# Patient Record
Sex: Female | Born: 1985 | Race: White | Hispanic: No | Marital: Married | Smoking: Never smoker
Health system: Southern US, Community
[De-identification: ages and names within clinical notes are randomized; demographics above are authoritative.]

## PROBLEM LIST (undated history)

## (undated) DIAGNOSIS — J45909 Unspecified asthma, uncomplicated: Secondary | ICD-10-CM

## (undated) DIAGNOSIS — E079 Disorder of thyroid, unspecified: Secondary | ICD-10-CM

## (undated) HISTORY — PX: APPENDECTOMY: SHX54

## (undated) HISTORY — PX: CHOLECYSTECTOMY: SHX55

## (undated) HISTORY — PX: KNEE SURGERY: SHX244

## (undated) HISTORY — PX: DILATION AND CURETTAGE OF UTERUS: SHX78

---

## 2008-10-12 ENCOUNTER — Emergency Department: Payer: Self-pay | Admitting: Emergency Medicine

## 2008-10-20 ENCOUNTER — Emergency Department: Payer: Self-pay | Admitting: Emergency Medicine

## 2008-11-21 ENCOUNTER — Emergency Department: Payer: Self-pay | Admitting: Unknown Physician Specialty

## 2009-01-14 ENCOUNTER — Emergency Department: Payer: Self-pay | Admitting: Emergency Medicine

## 2009-02-22 ENCOUNTER — Emergency Department: Payer: Self-pay | Admitting: Emergency Medicine

## 2009-06-24 ENCOUNTER — Emergency Department: Payer: Self-pay | Admitting: Emergency Medicine

## 2009-07-12 ENCOUNTER — Emergency Department: Payer: Self-pay | Admitting: Emergency Medicine

## 2009-11-25 ENCOUNTER — Observation Stay: Payer: Self-pay | Admitting: Obstetrics and Gynecology

## 2009-12-21 ENCOUNTER — Observation Stay: Payer: Self-pay | Admitting: Obstetrics and Gynecology

## 2009-12-24 ENCOUNTER — Emergency Department: Payer: Self-pay | Admitting: Unknown Physician Specialty

## 2010-01-22 ENCOUNTER — Observation Stay: Payer: Self-pay

## 2010-01-29 ENCOUNTER — Observation Stay: Payer: Self-pay | Admitting: Obstetrics & Gynecology

## 2010-03-07 ENCOUNTER — Observation Stay: Payer: Self-pay | Admitting: Obstetrics & Gynecology

## 2010-03-22 ENCOUNTER — Inpatient Hospital Stay: Payer: Self-pay | Admitting: Obstetrics and Gynecology

## 2010-03-31 ENCOUNTER — Emergency Department: Payer: Self-pay | Admitting: Emergency Medicine

## 2010-04-30 ENCOUNTER — Emergency Department: Payer: Self-pay | Admitting: Unknown Physician Specialty

## 2010-07-30 ENCOUNTER — Emergency Department: Payer: Self-pay | Admitting: Emergency Medicine

## 2010-10-10 ENCOUNTER — Emergency Department: Payer: Self-pay | Admitting: Emergency Medicine

## 2011-05-02 ENCOUNTER — Emergency Department: Payer: Self-pay | Admitting: Emergency Medicine

## 2011-05-26 ENCOUNTER — Emergency Department: Payer: Self-pay | Admitting: Unknown Physician Specialty

## 2011-06-07 ENCOUNTER — Emergency Department: Payer: Self-pay | Admitting: Emergency Medicine

## 2011-10-24 ENCOUNTER — Emergency Department: Payer: Self-pay | Admitting: Emergency Medicine

## 2012-01-27 ENCOUNTER — Emergency Department: Payer: Self-pay | Admitting: Emergency Medicine

## 2012-01-27 LAB — CBC
HCT: 32.6 % — ABNORMAL LOW (ref 35.0–47.0)
HGB: 11.5 g/dL — ABNORMAL LOW (ref 12.0–16.0)
MCHC: 35.4 g/dL (ref 32.0–36.0)
MCV: 87 fL (ref 80–100)
Platelet: 316 10*3/uL (ref 150–440)
RDW: 13.1 % (ref 11.5–14.5)

## 2012-01-27 LAB — URINALYSIS, COMPLETE
Glucose,UR: NEGATIVE mg/dL (ref 0–75)
Ketone: NEGATIVE
Leukocyte Esterase: NEGATIVE
Nitrite: NEGATIVE
Ph: 6 (ref 4.5–8.0)
Protein: NEGATIVE
WBC UR: 1 /HPF (ref 0–5)

## 2012-06-02 ENCOUNTER — Observation Stay: Payer: Self-pay | Admitting: Obstetrics and Gynecology

## 2012-06-02 LAB — URINALYSIS, COMPLETE
Bacteria: NONE SEEN
Glucose,UR: NEGATIVE mg/dL (ref 0–75)
Leukocyte Esterase: NEGATIVE
Nitrite: NEGATIVE
Ph: 6 (ref 4.5–8.0)
Protein: NEGATIVE
RBC,UR: 1 /HPF (ref 0–5)
Specific Gravity: 1.023 (ref 1.003–1.030)

## 2012-06-20 ENCOUNTER — Observation Stay: Payer: Self-pay | Admitting: Obstetrics and Gynecology

## 2012-06-20 LAB — URINALYSIS, COMPLETE
Bacteria: NONE SEEN
Bilirubin,UR: NEGATIVE
Leukocyte Esterase: NEGATIVE
Nitrite: NEGATIVE
Ph: 8 (ref 4.5–8.0)
Squamous Epithelial: 5
WBC UR: 2 /HPF (ref 0–5)

## 2012-06-20 LAB — CBC WITH DIFFERENTIAL/PLATELET
Basophil #: 0 10*3/uL (ref 0.0–0.1)
Basophil %: 0.1 %
Eosinophil #: 0 10*3/uL (ref 0.0–0.7)
Eosinophil %: 0.1 %
HCT: 33.6 % — ABNORMAL LOW (ref 35.0–47.0)
HGB: 11.4 g/dL — ABNORMAL LOW (ref 12.0–16.0)
Lymphocyte #: 0.4 10*3/uL — ABNORMAL LOW (ref 1.0–3.6)
Lymphocyte %: 2.9 %
MCH: 30.4 pg (ref 26.0–34.0)
MCV: 89 fL (ref 80–100)
Monocyte #: 0.4 x10 3/mm (ref 0.2–0.9)
Monocyte %: 2.7 %
Neutrophil #: 12.4 10*3/uL — ABNORMAL HIGH (ref 1.4–6.5)
Platelet: 271 10*3/uL (ref 150–440)
RBC: 3.77 10*6/uL — ABNORMAL LOW (ref 3.80–5.20)

## 2012-06-20 LAB — COMPREHENSIVE METABOLIC PANEL
Albumin: 2.8 g/dL — ABNORMAL LOW (ref 3.4–5.0)
Alkaline Phosphatase: 101 U/L (ref 50–136)
Anion Gap: 7 (ref 7–16)
BUN: 7 mg/dL (ref 7–18)
Bilirubin,Total: 0.2 mg/dL (ref 0.2–1.0)
Chloride: 106 mmol/L (ref 98–107)
Creatinine: 0.59 mg/dL — ABNORMAL LOW (ref 0.60–1.30)
EGFR (African American): >60
Glucose: 92 mg/dL (ref 65–99)
Osmolality: 273 (ref 275–301)
Potassium: 3.3 mmol/L — ABNORMAL LOW (ref 3.5–5.1)
SGOT(AST): 20 U/L (ref 15–37)
SGPT (ALT): 15 U/L (ref 12–78)
Sodium: 138 mmol/L (ref 136–145)
Total Protein: 6.1 g/dL — ABNORMAL LOW (ref 6.4–8.2)

## 2012-06-20 LAB — LIPASE, BLOOD: Lipase: 160 U/L (ref 73–393)

## 2012-09-04 ENCOUNTER — Observation Stay: Payer: Self-pay | Admitting: Advanced Practice Midwife

## 2012-09-09 ENCOUNTER — Observation Stay: Payer: Self-pay | Admitting: Obstetrics and Gynecology

## 2012-09-12 ENCOUNTER — Inpatient Hospital Stay: Payer: Self-pay

## 2012-09-13 LAB — CBC WITH DIFFERENTIAL/PLATELET
Basophil #: 0 10*3/uL (ref 0.0–0.1)
Eosinophil #: 0.1 10*3/uL (ref 0.0–0.7)
Eosinophil %: 0.4 %
HCT: 32.8 % — ABNORMAL LOW (ref 35.0–47.0)
HGB: 11.4 g/dL — ABNORMAL LOW (ref 12.0–16.0)
MCHC: 34.7 g/dL (ref 32.0–36.0)
Neutrophil #: 10.5 10*3/uL — ABNORMAL HIGH (ref 1.4–6.5)
Platelet: 265 10*3/uL (ref 150–440)
RBC: 3.75 10*6/uL — ABNORMAL LOW (ref 3.80–5.20)
RDW: 13.9 % (ref 11.5–14.5)
WBC: 13 10*3/uL — ABNORMAL HIGH (ref 3.6–11.0)

## 2012-09-14 LAB — HEMATOCRIT: HCT: 20.5 % — ABNORMAL LOW (ref 35.0–47.0)

## 2012-09-16 ENCOUNTER — Observation Stay: Payer: Self-pay | Admitting: Obstetrics and Gynecology

## 2012-09-16 LAB — BASIC METABOLIC PANEL
Chloride: 107 mmol/L (ref 98–107)
Co2: 26 mmol/L (ref 21–32)
Creatinine: 0.73 mg/dL (ref 0.60–1.30)
EGFR (Non-African Amer.): >60
Glucose: 89 mg/dL (ref 65–99)
Sodium: 140 mmol/L (ref 136–145)

## 2012-09-16 LAB — CBC
HCT: 21.5 % — ABNORMAL LOW (ref 35.0–47.0)
HGB: 7.5 g/dL — ABNORMAL LOW (ref 12.0–16.0)
MCHC: 34.4 g/dL (ref 32.0–36.0)
MCHC: 34.8 g/dL (ref 32.0–36.0)
Platelet: 281 10*3/uL (ref 150–440)
RBC: 2.4 10*6/uL — ABNORMAL LOW (ref 3.80–5.20)
WBC: 7.2 10*3/uL (ref 3.6–11.0)
WBC: 9.6 10*3/uL (ref 3.6–11.0)

## 2012-09-23 LAB — COMPREHENSIVE METABOLIC PANEL
Anion Gap: 9 (ref 7–16)
Bilirubin,Total: 0.2 mg/dL (ref 0.2–1.0)
Calcium, Total: 8.8 mg/dL (ref 8.5–10.1)
Chloride: 104 mmol/L (ref 98–107)
Co2: 24 mmol/L (ref 21–32)
Creatinine: 0.77 mg/dL (ref 0.60–1.30)
EGFR (Non-African Amer.): >60
Glucose: 83 mg/dL (ref 65–99)
Potassium: 3.6 mmol/L (ref 3.5–5.1)
SGOT(AST): 35 U/L (ref 15–37)
SGPT (ALT): 28 U/L (ref 12–78)
Total Protein: 7 g/dL (ref 6.4–8.2)

## 2012-09-23 LAB — URINALYSIS, COMPLETE
Bilirubin,UR: NEGATIVE
Glucose,UR: NEGATIVE mg/dL (ref 0–75)
Ph: 6 (ref 4.5–8.0)
Protein: NEGATIVE
RBC,UR: 9 /HPF (ref 0–5)
WBC UR: 26 /HPF (ref 0–5)

## 2012-09-23 LAB — CBC
HCT: 27.4 % — ABNORMAL LOW (ref 35.0–47.0)
MCH: 30.4 pg (ref 26.0–34.0)
MCHC: 34.3 g/dL (ref 32.0–36.0)
MCV: 89 fL (ref 80–100)
RBC: 3.09 10*6/uL — ABNORMAL LOW (ref 3.80–5.20)

## 2012-09-24 ENCOUNTER — Observation Stay: Payer: Self-pay | Admitting: Obstetrics and Gynecology

## 2012-10-01 LAB — CBC
HCT: 25.2 % — ABNORMAL LOW (ref 35.0–47.0)
HGB: 8.7 g/dL — ABNORMAL LOW (ref 12.0–16.0)
MCH: 29.3 pg (ref 26.0–34.0)
MCHC: 34.4 g/dL (ref 32.0–36.0)
MCV: 85 fL (ref 80–100)
Platelet: 426 10*3/uL (ref 150–440)
RBC: 2.96 10*6/uL — ABNORMAL LOW (ref 3.80–5.20)
RDW: 13.5 % (ref 11.5–14.5)
WBC: 9.5 10*3/uL (ref 3.6–11.0)

## 2012-10-01 LAB — URINALYSIS, COMPLETE
Bacteria: NONE SEEN
Bilirubin,UR: NEGATIVE
Nitrite: NEGATIVE
Ph: 5 (ref 4.5–8.0)
Protein: NEGATIVE
Specific Gravity: 1.016 (ref 1.003–1.030)
WBC UR: NONE SEEN /HPF (ref 0–5)

## 2012-10-02 LAB — CBC
HGB: 6.1 g/dL — ABNORMAL LOW (ref 12.0–16.0)
MCH: 28.2 pg (ref 26.0–34.0)
MCHC: 33.3 g/dL (ref 32.0–36.0)
MCV: 85 fL (ref 80–100)
Platelet: 342 10*3/uL (ref 150–440)
WBC: 11.7 10*3/uL — ABNORMAL HIGH (ref 3.6–11.0)

## 2012-10-02 LAB — BASIC METABOLIC PANEL
Anion Gap: 4 — ABNORMAL LOW (ref 7–16)
BUN: 8 mg/dL (ref 7–18)
Calcium, Total: 8.1 mg/dL — ABNORMAL LOW (ref 8.5–10.1)
Co2: 30 mmol/L (ref 21–32)
Creatinine: 0.94 mg/dL (ref 0.60–1.30)
Osmolality: 277 (ref 275–301)

## 2012-10-02 LAB — CBC WITH DIFFERENTIAL/PLATELET
Basophil #: 0 10*3/uL (ref 0.0–0.1)
Eosinophil %: 1.3 %
HCT: 16.1 % — ABNORMAL LOW (ref 35.0–47.0)
HGB: 5.5 g/dL — ABNORMAL LOW (ref 12.0–16.0)
Lymphocyte #: 0.9 10*3/uL — ABNORMAL LOW (ref 1.0–3.6)
Lymphocyte %: 15 %
MCHC: 34 g/dL (ref 32.0–36.0)
MCV: 86 fL (ref 80–100)
Monocyte %: 5.7 %
Neutrophil #: 4.6 10*3/uL (ref 1.4–6.5)
Neutrophil %: 77.7 %
Platelet: 279 10*3/uL (ref 150–440)
WBC: 5.9 10*3/uL (ref 3.6–11.0)

## 2012-10-03 ENCOUNTER — Inpatient Hospital Stay: Payer: Self-pay | Admitting: Obstetrics and Gynecology

## 2012-10-03 LAB — CBC WITH DIFFERENTIAL/PLATELET
Basophil %: 0.7 %
Eosinophil #: 0.4 10*3/uL (ref 0.0–0.7)
HGB: 6.8 g/dL — ABNORMAL LOW (ref 12.0–16.0)
Lymphocyte #: 1.6 10*3/uL (ref 1.0–3.6)
Lymphocyte %: 31.1 %
MCH: 28.1 pg (ref 26.0–34.0)
MCHC: 32.8 g/dL (ref 32.0–36.0)
MCV: 86 fL (ref 80–100)
Monocyte #: 0.5 x10 3/mm (ref 0.2–0.9)
Monocyte %: 9 %
Neutrophil #: 2.6 10*3/uL (ref 1.4–6.5)
Neutrophil %: 50.7 %
Platelet: 274 10*3/uL (ref 150–440)
WBC: 5.2 10*3/uL (ref 3.6–11.0)

## 2012-10-03 LAB — URINALYSIS, COMPLETE
Glucose,UR: NEGATIVE mg/dL (ref 0–75)
Ketone: NEGATIVE
Leukocyte Esterase: NEGATIVE
Protein: NEGATIVE
Specific Gravity: 1.01 (ref 1.003–1.030)
WBC UR: 1 /HPF (ref 0–5)

## 2012-10-24 ENCOUNTER — Emergency Department: Payer: Self-pay | Admitting: Emergency Medicine

## 2012-10-24 LAB — CBC
HGB: 9.8 g/dL — ABNORMAL LOW (ref 12.0–16.0)
MCHC: 34.1 g/dL (ref 32.0–36.0)
Platelet: 344 10*3/uL (ref 150–440)
RBC: 3.4 10*6/uL — ABNORMAL LOW (ref 3.80–5.20)
RDW: 15 % — ABNORMAL HIGH (ref 11.5–14.5)
WBC: 5.4 10*3/uL (ref 3.6–11.0)

## 2012-10-24 LAB — URINALYSIS, COMPLETE
Bacteria: NONE SEEN
Glucose,UR: NEGATIVE mg/dL (ref 0–75)
Leukocyte Esterase: NEGATIVE
Ph: 5 (ref 4.5–8.0)
Squamous Epithelial: <1
WBC UR: <1 /HPF (ref 0–5)

## 2012-10-24 LAB — HCG, QUANTITATIVE, PREGNANCY: Beta Hcg, Quant.: <1 m[IU]/mL — ABNORMAL LOW

## 2012-11-20 ENCOUNTER — Emergency Department: Payer: Self-pay | Admitting: Emergency Medicine

## 2012-12-29 ENCOUNTER — Emergency Department: Payer: Self-pay | Admitting: Emergency Medicine

## 2012-12-29 LAB — COMPREHENSIVE METABOLIC PANEL
Albumin: 3.8 g/dL (ref 3.4–5.0)
Alkaline Phosphatase: 133 U/L (ref 50–136)
Anion Gap: 4 — ABNORMAL LOW (ref 7–16)
BUN: 11 mg/dL (ref 7–18)
Calcium, Total: 8.9 mg/dL (ref 8.5–10.1)
Creatinine: 0.73 mg/dL (ref 0.60–1.30)
Glucose: 108 mg/dL — ABNORMAL HIGH (ref 65–99)
Potassium: 3.4 mmol/L — ABNORMAL LOW (ref 3.5–5.1)
SGOT(AST): 26 U/L (ref 15–37)
Sodium: 138 mmol/L (ref 136–145)
Total Protein: 7.3 g/dL (ref 6.4–8.2)

## 2012-12-29 LAB — CBC
HCT: 33.9 % — ABNORMAL LOW (ref 35.0–47.0)
HGB: 11.5 g/dL — ABNORMAL LOW (ref 12.0–16.0)
MCH: 27 pg (ref 26.0–34.0)
MCHC: 33.8 g/dL (ref 32.0–36.0)
Platelet: 345 10*3/uL (ref 150–440)
RDW: 16.1 % — ABNORMAL HIGH (ref 11.5–14.5)
WBC: 12.2 10*3/uL — ABNORMAL HIGH (ref 3.6–11.0)

## 2012-12-29 LAB — URINALYSIS, COMPLETE
Bacteria: NONE SEEN
Bilirubin,UR: NEGATIVE
Glucose,UR: NEGATIVE mg/dL (ref 0–75)
Ketone: NEGATIVE
Nitrite: NEGATIVE
Ph: 6 (ref 4.5–8.0)
Protein: NEGATIVE
RBC,UR: <1 /HPF (ref 0–5)
Squamous Epithelial: 1

## 2012-12-29 LAB — WET PREP, GENITAL

## 2012-12-29 LAB — GC/CHLAMYDIA PROBE AMP

## 2013-01-06 ENCOUNTER — Emergency Department: Payer: Self-pay | Admitting: Internal Medicine

## 2013-01-06 LAB — URINALYSIS, COMPLETE
Bacteria: NONE SEEN
Bilirubin,UR: NEGATIVE
Glucose,UR: NEGATIVE mg/dL (ref 0–75)
Ketone: NEGATIVE
Nitrite: NEGATIVE
Ph: 6 (ref 4.5–8.0)
Protein: NEGATIVE
Specific Gravity: 1.005 (ref 1.003–1.030)
WBC UR: NONE SEEN /HPF (ref 0–5)

## 2013-01-06 LAB — COMPREHENSIVE METABOLIC PANEL
Alkaline Phosphatase: 124 U/L (ref 50–136)
Calcium, Total: 8.9 mg/dL (ref 8.5–10.1)
Co2: 27 mmol/L (ref 21–32)
Creatinine: 0.78 mg/dL (ref 0.60–1.30)
EGFR (Non-African Amer.): >60
Glucose: 102 mg/dL — ABNORMAL HIGH (ref 65–99)
SGPT (ALT): 25 U/L (ref 12–78)
Sodium: 140 mmol/L (ref 136–145)
Total Protein: 7 g/dL (ref 6.4–8.2)

## 2013-01-06 LAB — CBC
HGB: 11.7 g/dL — ABNORMAL LOW (ref 12.0–16.0)
MCH: 27 pg (ref 26.0–34.0)
MCHC: 33.5 g/dL (ref 32.0–36.0)
MCV: 80 fL (ref 80–100)
Platelet: 265 10*3/uL (ref 150–440)
RBC: 4.34 10*6/uL (ref 3.80–5.20)
RDW: 16.4 % — ABNORMAL HIGH (ref 11.5–14.5)
WBC: 7.5 10*3/uL (ref 3.6–11.0)

## 2013-03-28 ENCOUNTER — Emergency Department: Payer: Self-pay | Admitting: Emergency Medicine

## 2013-05-04 ENCOUNTER — Emergency Department: Payer: Self-pay | Admitting: Emergency Medicine

## 2013-08-16 ENCOUNTER — Emergency Department: Payer: Self-pay | Admitting: Emergency Medicine

## 2013-08-16 LAB — CBC WITH DIFFERENTIAL/PLATELET
Basophil #: 0 10*3/uL (ref 0.0–0.1)
Basophil %: 0.3 %
EOS PCT: 1.7 %
Eosinophil #: 0.1 10*3/uL (ref 0.0–0.7)
HCT: 37.8 % (ref 35.0–47.0)
HGB: 12.6 g/dL (ref 12.0–16.0)
LYMPHS ABS: 1.2 10*3/uL (ref 1.0–3.6)
Lymphocyte %: 15.7 %
MCH: 29.4 pg (ref 26.0–34.0)
MCHC: 33.4 g/dL (ref 32.0–36.0)
MCV: 88 fL (ref 80–100)
Monocyte #: 0.5 x10 3/mm (ref 0.2–0.9)
Monocyte %: 6.1 %
NEUTROS ABS: 6 10*3/uL (ref 1.4–6.5)
Neutrophil %: 76.2 %
Platelet: 338 10*3/uL (ref 150–440)
RBC: 4.29 10*6/uL (ref 3.80–5.20)
RDW: 12.7 % (ref 11.5–14.5)
WBC: 7.9 10*3/uL (ref 3.6–11.0)

## 2013-08-16 LAB — COMPREHENSIVE METABOLIC PANEL
ALBUMIN: 4 g/dL (ref 3.4–5.0)
Alkaline Phosphatase: 96 U/L
Anion Gap: 5 — ABNORMAL LOW (ref 7–16)
BILIRUBIN TOTAL: 0.3 mg/dL (ref 0.2–1.0)
BUN: 11 mg/dL (ref 7–18)
Calcium, Total: 8.9 mg/dL (ref 8.5–10.1)
Chloride: 107 mmol/L (ref 98–107)
Co2: 27 mmol/L (ref 21–32)
Creatinine: 0.88 mg/dL (ref 0.60–1.30)
EGFR (African American): >60
GLUCOSE: 87 mg/dL (ref 65–99)
Osmolality: 276 (ref 275–301)
POTASSIUM: 3.6 mmol/L (ref 3.5–5.1)
SGOT(AST): 26 U/L (ref 15–37)
SGPT (ALT): 17 U/L (ref 12–78)
Sodium: 139 mmol/L (ref 136–145)
TOTAL PROTEIN: 7.4 g/dL (ref 6.4–8.2)

## 2013-09-16 ENCOUNTER — Emergency Department: Payer: Self-pay | Admitting: Emergency Medicine

## 2013-09-16 LAB — URINALYSIS, COMPLETE
BACTERIA: NONE SEEN
BLOOD: NEGATIVE
Bilirubin,UR: NEGATIVE
GLUCOSE, UR: NEGATIVE mg/dL (ref 0–75)
Ketone: NEGATIVE
Leukocyte Esterase: NEGATIVE
Nitrite: NEGATIVE
PH: 6 (ref 4.5–8.0)
Protein: NEGATIVE
Specific Gravity: 1.011 (ref 1.003–1.030)
Squamous Epithelial: 1
WBC UR: NONE SEEN /HPF (ref 0–5)

## 2013-09-16 LAB — CBC WITH DIFFERENTIAL/PLATELET
Basophil #: 0 10*3/uL (ref 0.0–0.1)
Basophil %: 0.3 %
Eosinophil #: 0.2 10*3/uL (ref 0.0–0.7)
Eosinophil %: 1.5 %
HCT: 36.8 % (ref 35.0–47.0)
HGB: 12.3 g/dL (ref 12.0–16.0)
Lymphocyte #: 1.9 10*3/uL (ref 1.0–3.6)
Lymphocyte %: 17.8 %
MCH: 29.9 pg (ref 26.0–34.0)
MCHC: 33.5 g/dL (ref 32.0–36.0)
MCV: 89 fL (ref 80–100)
Monocyte #: 0.6 x10 3/mm (ref 0.2–0.9)
Monocyte %: 5.8 %
NEUTROS PCT: 74.6 %
Neutrophil #: 7.9 10*3/uL — ABNORMAL HIGH (ref 1.4–6.5)
Platelet: 327 10*3/uL (ref 150–440)
RBC: 4.12 10*6/uL (ref 3.80–5.20)
RDW: 13.1 % (ref 11.5–14.5)
WBC: 10.6 10*3/uL (ref 3.6–11.0)

## 2013-09-16 LAB — COMPREHENSIVE METABOLIC PANEL
ALBUMIN: 3.8 g/dL (ref 3.4–5.0)
AST: 29 U/L (ref 15–37)
Alkaline Phosphatase: 105 U/L
Anion Gap: 8 (ref 7–16)
BUN: 14 mg/dL (ref 7–18)
Bilirubin,Total: 0.2 mg/dL (ref 0.2–1.0)
CALCIUM: 8.8 mg/dL (ref 8.5–10.1)
CHLORIDE: 105 mmol/L (ref 98–107)
CREATININE: 0.97 mg/dL (ref 0.60–1.30)
Co2: 25 mmol/L (ref 21–32)
GLUCOSE: 116 mg/dL — AB (ref 65–99)
Osmolality: 277 (ref 275–301)
POTASSIUM: 3.6 mmol/L (ref 3.5–5.1)
SGPT (ALT): 20 U/L (ref 12–78)
Sodium: 138 mmol/L (ref 136–145)
Total Protein: 7.3 g/dL (ref 6.4–8.2)

## 2013-09-16 LAB — LIPASE, BLOOD: LIPASE: 144 U/L (ref 73–393)

## 2013-09-16 LAB — WET PREP, GENITAL

## 2013-09-16 LAB — GC/CHLAMYDIA PROBE AMP

## 2013-10-26 ENCOUNTER — Emergency Department: Payer: Self-pay | Admitting: Emergency Medicine

## 2013-10-27 LAB — COMPREHENSIVE METABOLIC PANEL
AST: 19 U/L (ref 15–37)
Albumin: 3.8 g/dL (ref 3.4–5.0)
Alkaline Phosphatase: 101 U/L
Anion Gap: 6 — ABNORMAL LOW (ref 7–16)
BUN: 15 mg/dL (ref 7–18)
Bilirubin,Total: 0.2 mg/dL (ref 0.2–1.0)
CHLORIDE: 107 mmol/L (ref 98–107)
Calcium, Total: 8.1 mg/dL — ABNORMAL LOW (ref 8.5–10.1)
Co2: 28 mmol/L (ref 21–32)
Creatinine: 0.87 mg/dL (ref 0.60–1.30)
EGFR (African American): >60
GLUCOSE: 92 mg/dL (ref 65–99)
Osmolality: 282 (ref 275–301)
POTASSIUM: 3.9 mmol/L (ref 3.5–5.1)
SGPT (ALT): 22 U/L
SODIUM: 141 mmol/L (ref 136–145)
Total Protein: 7.2 g/dL (ref 6.4–8.2)

## 2013-10-27 LAB — CBC WITH DIFFERENTIAL/PLATELET
Basophil #: 0.4 10*3/uL — ABNORMAL HIGH (ref 0.0–0.1)
Basophil %: 4.7 %
Eosinophil #: 0.2 10*3/uL (ref 0.0–0.7)
Eosinophil %: 2.7 %
HCT: 36.7 % (ref 35.0–47.0)
HGB: 12.5 g/dL (ref 12.0–16.0)
LYMPHS ABS: 1.2 10*3/uL (ref 1.0–3.6)
Lymphocyte %: 16.3 %
MCH: 30.4 pg (ref 26.0–34.0)
MCHC: 34.2 g/dL (ref 32.0–36.0)
MCV: 89 fL (ref 80–100)
Monocyte #: 0.4 x10 3/mm (ref 0.2–0.9)
Monocyte %: 5.9 %
NEUTROS PCT: 70.4 %
Neutrophil #: 5.4 10*3/uL (ref 1.4–6.5)
Platelet: 371 10*3/uL (ref 150–440)
RBC: 4.13 10*6/uL (ref 3.80–5.20)
RDW: 13.3 % (ref 11.5–14.5)
WBC: 7.6 10*3/uL (ref 3.6–11.0)

## 2013-10-27 LAB — URINALYSIS, COMPLETE
BLOOD: NEGATIVE
Bacteria: NONE SEEN
Bilirubin,UR: NEGATIVE
Glucose,UR: NEGATIVE mg/dL (ref 0–75)
KETONE: NEGATIVE
LEUKOCYTE ESTERASE: NEGATIVE
NITRITE: NEGATIVE
PH: 6 (ref 4.5–8.0)
PROTEIN: NEGATIVE
RBC,UR: 2 /HPF (ref 0–5)
SPECIFIC GRAVITY: 1.015 (ref 1.003–1.030)
WBC UR: 2 /HPF (ref 0–5)

## 2013-10-27 LAB — LIPASE, BLOOD: Lipase: 122 U/L (ref 73–393)

## 2013-12-01 ENCOUNTER — Emergency Department: Payer: Self-pay | Admitting: Emergency Medicine

## 2014-02-16 ENCOUNTER — Emergency Department: Payer: Self-pay | Admitting: Student

## 2014-02-16 LAB — COMPREHENSIVE METABOLIC PANEL
ALT: 23 U/L
ANION GAP: 6 — AB (ref 7–16)
Albumin: 3.8 g/dL (ref 3.4–5.0)
Alkaline Phosphatase: 113 U/L
BILIRUBIN TOTAL: 0.3 mg/dL (ref 0.2–1.0)
BUN: 11 mg/dL (ref 7–18)
Calcium, Total: 8.7 mg/dL (ref 8.5–10.1)
Chloride: 106 mmol/L (ref 98–107)
Co2: 25 mmol/L (ref 21–32)
Creatinine: 0.85 mg/dL (ref 0.60–1.30)
EGFR (African American): >60
EGFR (Non-African Amer.): >60
Glucose: 152 mg/dL — ABNORMAL HIGH (ref 65–99)
Osmolality: 276 (ref 275–301)
Potassium: 4.4 mmol/L (ref 3.5–5.1)
SGOT(AST): 28 U/L (ref 15–37)
SODIUM: 137 mmol/L (ref 136–145)
Total Protein: 7.1 g/dL (ref 6.4–8.2)

## 2014-02-16 LAB — URINALYSIS, COMPLETE
BILIRUBIN, UR: NEGATIVE
Bacteria: NONE SEEN
Glucose,UR: NEGATIVE mg/dL (ref 0–75)
Ketone: NEGATIVE
LEUKOCYTE ESTERASE: NEGATIVE
NITRITE: NEGATIVE
PH: 5 (ref 4.5–8.0)
PROTEIN: NEGATIVE
RBC,UR: 3 /HPF (ref 0–5)
Specific Gravity: 1.016 (ref 1.003–1.030)
Squamous Epithelial: 2
WBC UR: 4 /HPF (ref 0–5)

## 2014-02-16 LAB — CBC
HCT: 38.9 % (ref 35.0–47.0)
HGB: 13.2 g/dL (ref 12.0–16.0)
MCH: 30.3 pg (ref 26.0–34.0)
MCHC: 33.9 g/dL (ref 32.0–36.0)
MCV: 89 fL (ref 80–100)
Platelet: 376 10*3/uL (ref 150–440)
RBC: 4.36 10*6/uL (ref 3.80–5.20)
RDW: 13.5 % (ref 11.5–14.5)
WBC: 10.1 10*3/uL (ref 3.6–11.0)

## 2014-02-16 LAB — LIPASE, BLOOD: LIPASE: 149 U/L (ref 73–393)

## 2014-02-16 LAB — PREGNANCY, URINE: Pregnancy Test, Urine: NEGATIVE m[IU]/mL

## 2014-02-18 LAB — URINE CULTURE

## 2014-04-18 ENCOUNTER — Emergency Department: Payer: Self-pay | Admitting: Emergency Medicine

## 2014-04-18 LAB — COMPREHENSIVE METABOLIC PANEL
Albumin: 3.5 g/dL (ref 3.4–5.0)
Alkaline Phosphatase: 108 U/L (ref 46–116)
Anion Gap: 9 (ref 7–16)
BUN: 11 mg/dL (ref 7–18)
Bilirubin,Total: 0.3 mg/dL (ref 0.2–1.0)
CALCIUM: 8.6 mg/dL (ref 8.5–10.1)
CHLORIDE: 107 mmol/L (ref 98–107)
Co2: 24 mmol/L (ref 21–32)
Creatinine: 0.98 mg/dL (ref 0.60–1.30)
EGFR (Non-African Amer.): >60
Glucose: 145 mg/dL — ABNORMAL HIGH (ref 65–99)
Osmolality: 281 (ref 275–301)
POTASSIUM: 3.7 mmol/L (ref 3.5–5.1)
SGOT(AST): 25 U/L (ref 15–37)
SGPT (ALT): 26 U/L (ref 14–63)
Sodium: 140 mmol/L (ref 136–145)
TOTAL PROTEIN: 7.3 g/dL (ref 6.4–8.2)

## 2014-04-18 LAB — CBC WITH DIFFERENTIAL/PLATELET
Basophil #: 0 10*3/uL (ref 0.0–0.1)
Basophil %: 0.4 %
EOS PCT: 1.2 %
Eosinophil #: 0.1 10*3/uL (ref 0.0–0.7)
HCT: 40.3 % (ref 35.0–47.0)
HGB: 13.3 g/dL (ref 12.0–16.0)
LYMPHS ABS: 2.1 10*3/uL (ref 1.0–3.6)
LYMPHS PCT: 19.1 %
MCH: 29.4 pg (ref 26.0–34.0)
MCHC: 33.1 g/dL (ref 32.0–36.0)
MCV: 89 fL (ref 80–100)
Monocyte #: 0.7 x10 3/mm (ref 0.2–0.9)
Monocyte %: 6.2 %
Neutrophil #: 8.1 10*3/uL — ABNORMAL HIGH (ref 1.4–6.5)
Neutrophil %: 73.1 %
Platelet: 345 10*3/uL (ref 150–440)
RBC: 4.54 10*6/uL (ref 3.80–5.20)
RDW: 12.8 % (ref 11.5–14.5)
WBC: 11.1 10*3/uL — ABNORMAL HIGH (ref 3.6–11.0)

## 2014-04-18 LAB — URINALYSIS, COMPLETE
BACTERIA: NONE SEEN
Bilirubin,UR: NEGATIVE
GLUCOSE, UR: NEGATIVE mg/dL (ref 0–75)
KETONE: NEGATIVE
NITRITE: NEGATIVE
Ph: 5 (ref 4.5–8.0)
Protein: NEGATIVE
RBC,UR: 2 /HPF (ref 0–5)
SPECIFIC GRAVITY: 1.013 (ref 1.003–1.030)
Squamous Epithelial: 4
WBC UR: 3 /HPF (ref 0–5)

## 2014-04-18 LAB — LIPASE, BLOOD: LIPASE: 165 U/L (ref 73–393)

## 2014-04-18 LAB — GC/CHLAMYDIA PROBE AMP

## 2014-04-18 LAB — WET PREP, GENITAL

## 2014-05-04 ENCOUNTER — Inpatient Hospital Stay: Payer: Self-pay | Admitting: Psychiatry

## 2014-07-01 NOTE — H&P (Signed)
Subjective/Chief Complaint Postpartum vaginal bleeding   History of Present Illness 29 yo G2P2002 who is postpartum day #19 s/p spontaneous vaginal delivery complicated by a shoulder dystocia that was relieved by McRoberts and suprapubic pressure.  Her post-partum course was complicated by acute blood loss anemia (no specific post-partum hemorrhagic event was noted) with a post-partum day #1 hematocrit of 20.5%.  She declined a blood transfusion and was felt to be stable for discharge.  She was readmitted one week later after passing a large ("grapefruit sized") blood clot and marble size boo clots.  When admitted she was treated conservatively with medication and had no further events. Pt decline D and C and wanted to see if her body could pass the clots by itself. [place] at the tiem showe thickened enometriurm with up to 6 cm in depth area.   Her blood counts were checked during that hospitalization and were higher than at discharge and was about 27.   She presented to the ED his evening and was started in Pitocin an ivf to stop the bleeding and contract to help with the placenta emoval. US shows thickened enometrium of 3 cm, and patient is passing clots the size of lemons with about 300 c on the chux at this time.  She denies lightheadedness, dizzyness, palpitations, chest pain, trouble breathing.  She has had no fevers, chills, or abdominal pain. she is very scared and still does not want a d and c if we can avoid it/   Past Medical Health asthma, bipolar, HPV, knee pain,   Past Med/Surgical Hx:  Genital Herpes:   Asthma:   manic/depressant:   HPV:   chronic knee pain:   r knee ACL surgery:   ALLERGIES:  Flagyl: N/V/Diarrhea  Anti-inflammatories: GI Distress  Biaxin: Unknown  Bactrim: Hives  Family and Social History:  Family History Non-Contributory   Social History negative tobacco, negative ETOH, negative Illicit drugs   Place of Living Home   Review of Systems:  Subjective/Chief  Complaint having blood clots and pain   Fever/Chills No   Cough No   Sputum No   Abdominal Pain Yes   Diarrhea No   Constipation No   Nausea/Vomiting No   SOB/DOE No   Chest Pain No   Dysuria No   Medications/Allergies Reviewed Medications/Allergies reviewed   Physical Exam:  GEN well developed, well nourished, obese, anxious   HEENT pink conjunctivae, hearing intact to voice, moist oral mucosa   NECK supple  No masses   RESP normal resp effort  clear BS   CARD regular rate   ABD positive tenderness  denies Flank Tenderness  at uterus   LYMPH negative neck   EXTR negative cyanosis/clubbing   SKIN normal to palpation, No rashes   NEURO negative rigidity, negative tremor   PSYCH alert, A+O to time, place, person, good insight, anxious   Additional Comments she is having heavy bleeding with clots an US shows pacenta most ikely, she has been started on it and the placenta is cramping, an feel like it is trying to deiver. wil attempt to remove with ring forcpes asi t does not want to have surgery if possible and she is very scared wil start i vantibiotics and give her some iv anxiety meds.    Assessment/Admission Diagnosis retained placenta with bleeding   Plan start pimt and remove placenta by and c if needed.,   Electronic Signatures: Adria Devon (MD)  (Signed 24-Jul-14 22:20)  Authored: CHIEF COMPLAINT and HISTORY,  PAST MEDICAL/SURGIAL HISTORY, ALLERGIES, HOME MEDICATIONS, FAMILY AND SOCIAL HISTORY, REVIEW OF SYSTEMS, PHYSICAL EXAM, ASSESSMENT AND PLAN   Last Updated: 24-Jul-14 22:20 by Adria DevonKlett, Eleaner Dibartolo (MD)

## 2014-07-01 NOTE — H&P (Signed)
Subjective/Chief Complaint Passed large blood clot vaginally   History of Present Illness 29 yo G2P2002 who is postpartum day #3 s/p spontaneous vaginal delivery complicated by a shoulder dystocia that was relieved by McRoberts and suprapubic pressure.  Her post-operative course was complicated by acute blood loss anemia (no specific post-partum hemorrhagic event was noted) with a post-partum day #1 hematocrit of 20.5%.  She declined a blood transfusion and was felt to be stable for discharge.  She presents this morning after having passed a large blood clot (size of a grapefruit).  Leading up to this event she has had similar lochia to that experienced at the hospital in the postpartum period.  She presented to the hospital without other symptoms. She denies lightheadedness, dizzyness, palpitations, chest pain, trouble breathing.  She has had no fevers, chills, or abdominal pain.   Past Med/Surgical Hx:  Genital Herpes:   Asthma:   manic/depressant:   HPV:   chronic knee pain:   r knee ACL surgery:   ALLERGIES:  Flagyl: N/V/Diarrhea  Anti-inflammatories: GI Distress  Biaxin: Unknown  Bactrim: Hives  HOME MEDICATIONS: Medication Instructions Status  acetaminophen-HYDROcodone 325 mg-5 mg oral tablet 1 tab(s) orally every 4 to 6 hours, As needed, pain Active  hydrocortisone-pramoxine 1%-1% topical foam 1 application topically 3 to 4 times a day, As needed, . Active  docusate sodium 100 mg oral capsule 1 cap(s) orally 2 times a day Active  Prenatal Multivitamins with Folic Acid 1 mg oral tablet 1 tab(s) orally once a day Active  ferrous sulfate 325 mg (65 mg elemental iron) oral tablet 1 tab(s) orally 2 times a day if Integra F is not available. Active  ProAir HFA CFC free 90 mcg/inh aerosol with adapter 2 puff(s) inhaled 4 times a day x 30 days as needed   Active   Family and Social History:  Family History Non-Contributory   Social History negative tobacco, negative ETOH, negative  Illicit drugs   Place of Living Home   Review of Systems:  Fever/Chills No   Cough No   Sputum No   Abdominal Pain No   Diarrhea No   Constipation No   Nausea/Vomiting No   SOB/DOE No   Chest Pain No   Dysuria No   Tolerating Diet Yes   Medications/Allergies Reviewed Medications/Allergies reviewed   Physical Exam:  GEN well developed, well nourished, appears upset and as if she has been crying   NECK supple  No masses  trachea midline   RESP normal resp effort  clear BS  no use of accessory muscles   CARD regular rate  II/VI systolic ejection murmur over left second intercostal space   ABD denies tenderness  soft  normal BS  uterus palpable at U-2 and firm   GU NEFG, Normal BUS, there is scant-to-mild bright red blood in vaginal vault.  A marble sized blood clot is removed from vagina.  No heavy bleeding or clots coming from cervix. On bimanual, uterusi non-tender and at 2cm below umbilicus. The fundus is firm and non-tender.  The cervix is mildly tender to palpation.  No clot palpated at cervix.   EXTR negative edema   SKIN erythematous patches on abdomen, no excoriations   NEURO motor/sensory function intact   PSYCH alert, A+O to time, place, person, good insight   Additional Comments Temp 98.19F (36.8C). Pulse : 94, RR 20, BP 124/74, Pulse Ox: 99 %  Labs: Hct on admission (7/6) 32.8, hct 7/7 = 20.5%, hct this AM  21.3%  Pelic U/S (09/16/12) - Preliminary report: Endometrium markedly thickened measuring 4.1cm.  There is echogenic material that has increased blood flow.  Uterus is enlarged, 16.1 x 8.2 x 12 cm.  There were no other remarkable findings noted.  Differential for intrauterine material included retained products of conception.   Lab Results:  Routine Hem:  09-Jul-14 03:37   WBC (CBC) 7.2  RBC (CBC)  2.40  Hemoglobin (CBC)  7.3  Hematocrit (CBC)  21.3  Platelet Count (CBC) 268 (Result(s) reported on 16 Sep 2012 at 04:10AM.)  MCV 88  MCH 30.4   MCHC 34.4  RDW 13.8    Assessment/Admission Diagnosis [age] yo G2P2002 who is postpartum day #3 s/p spontaneous vaginal delivery complicated by shoulder dystocia and acute blood loss anemia with no clear single event where she had significant blood loss apart from ongoing RN documentation of heavy lochia with clots.  She presents with delayed postpartum hemorrhage and is hemodynamically stable at this time with her blood counts be stable.   Plan Delayed Postpartum Hemorrhage:  - Admit for observation and give trial of methergine to help with bleeding.   - Discussed option of directly going to surgery as first line for D&C versus conservatively treating her with uterotonics.  Given her clinical stability and her lack of active bleeding on exam, after discussion she elects to undergo a period of observation. - If becomes clinically unstable or heavy bleeding continues, will take to OR for D&C.   - Will keep NPO for now except for medications.   Acute blood loss anemia: - Offered blood transfusion and discussed her lack of reserve should heavy bleeding continue.  She declines at this time in favor of seeing how her blood counts change when rechecked. - Will recheck CBC at noon today to assess for equilibration of blood counts to further guide treatment.  Asthma- I have ordered her home albuterol.  She is stable from that point right now.  Will continue routine postpartum care while she is in the hospital.    As she was admitted at change of MD shift, I have personally discussed the case with Dr. Annamarie MajorPaul Harris who will be assuming her care during the day today and would be surgeon in case D&C becomes necessary.   Electronic Signatures: Conard NovakJackson, Philisha Weinel D (MD)  (Signed 09-Jul-14 07:56)  Authored: CHIEF COMPLAINT and HISTORY, PAST MEDICAL/SURGIAL HISTORY, ALLERGIES, HOME MEDICATIONS, FAMILY AND SOCIAL HISTORY, REVIEW OF SYSTEMS, PHYSICAL EXAM, LABS, Radiology, ASSESSMENT AND PLAN   Last Updated:  09-Jul-14 07:56 by Conard NovakJackson, Ivis Nicolson D (MD)

## 2014-07-01 NOTE — Op Note (Signed)
PATIENT NAME:  Jessica Wong, Josette MR#:  [id number] DATE OF BIRTH:  November 06, 1985  DATE OF PROCEDURE:  10/01/2012   SURGEON: Elliot Gurneyarrie C. Emilliano Dilworth, MD   NOTE: This  is not a surgical operative note.  She was not taken to the OR. This was done in her bed in her room in the hospital.  PROCEDURE: The patient was laid supine on her bed.  A side-opening speculum was placed in the patient's vagina.  The placenta edge that was extruding from the cervix was grasped with a single-tooth tenaculum, and with gentle traction the placenta was removed intact en toto, which stopped the bleeding immediately. Pitocin was started. The patient's uterus contracted down.  One pass with the smaller banjo curette was done to be sure that the remainder of the placenta had been removed. The uterus was found to be firm and clear. Procedure was completed, and the speculum was removed from the patient's vagina.   ____________________________ Elliot Gurneyarrie C. Shonte Beutler, MD cck:cb D: 10/07/2012 21:05:00 ET T: 10/07/2012 21:19:51 ET JOB#: [id number]  cc: Elliot Gurneyarrie C. Lorene Klimas, MD, <Dictator> Elliot GurneyARRIE C Arvis Zwahlen MD ELECTRONICALLY SIGNED 10/08/2012 20:08

## 2014-07-01 NOTE — H&P (Signed)
Subjective/Chief Complaint Postpartum vaginal bleeding   History of Present Illness 29 yo G2P2002 who is postpartum day #12 s/p spontaneous vaginal delivery complicated by a shoulder dystocia that was relieved by McRoberts and suprapubic pressure.  Her post-partum course was complicated by acute blood loss anemia (no specific post-partum hemorrhagic event was noted) with a post-partum day #1 hematocrit of 20.5%.  She declined a blood transfusion and was felt to be stable for discharge.  She was readmitted one week ago after passing a large ("football sized") blood clot and "gushing" blood.  When admitted she was treated conservatively with medication and had no further events.  Her blood counts were checked during that hospitalization and were roughly the same to slightly higher than at the time of discharge when she delivered.  She called the office yesterday after starting to pass "small" blood clots with her lochia and was instructed to return to the ER if her bleeding were heavy like before or to make an appointment in the office if bleeding was lighter.  She presented to the ED last night and was admitted this morning by another provider for management.  She denies lightheadedness, dizzyness, palpitations, chest pain, trouble breathing.  She has had no fevers, chills, or abdominal pain.   Past Med/Surgical Hx:  Genital Herpes:   Asthma:   manic/depressant:   HPV:   chronic knee pain:   r knee ACL surgery:   ALLERGIES:  Flagyl: N/V/Diarrhea  Anti-inflammatories: GI Distress  Biaxin: Unknown  Bactrim: Hives  HOME MEDICATIONS: Medication Instructions Status  ferrous sulfate 325 mg (65 mg elemental iron) oral tablet 1 tab(s) orally 2 times a day (with meals) Active  Prenatal Multivitamins with Folic Acid 1 mg oral tablet 1 tab(s) orally once a day Active  albuterol 2.5 mg/3 mL (0.083%) solution 3 mL inhaled every 6 hours x 30 days, As Needed- for Wheezing  Active  ProAir HFA CFC free 90  mcg/inh aerosol with adapter 2 puff(s) inhaled 4 times a day x 30 days as needed   Active  Pulmicort Flexhaler 90 mcg/inh inhalation powder 1 puff(s) inhaled 2 times a day Active   Family and Social History:  Family History Non-Contributory   Social History negative tobacco, negative ETOH, negative Illicit drugs   Place of Living Home   Review of Systems:  Fever/Chills No   Cough No   Sputum No   Abdominal Pain No   Diarrhea No   Constipation No   Nausea/Vomiting No   SOB/DOE No   Chest Pain No   Dysuria No   Tolerating Diet Yes   Medications/Allergies Reviewed Medications/Allergies reviewed   Physical Exam:  GEN well developed, well nourished, no acute distress   NECK supple  No masses  trachea midline   RESP normal resp effort  clear BS  no use of accessory muscles   CARD regular rate  II/VI systolic ejection murmur over left second intercostal space   ABD denies tenderness  soft  normal BS  uterus palpable above pubic bone and firm   EXTR negative edema   NEURO motor/sensory function intact   PSYCH alert, A+O to time, place, person, good insight   Additional Comments Temp 98.52F (36.9C). Pulse : , RR 18 , BP 109/67, Pulse Ox: 100 % RA  Labs: Hct on admission (7/6) 32.8, hct 7/7 = 20.5%, 7/9 21.3%, 7/16 = 27.4%   Lab Results: Hepatic:  16-Jul-14 18:50   Bilirubin, Total 0.2  Alkaline Phosphatase  183  SGPT (  ALT) 28  SGOT (AST) 35  Total Protein, Serum 7.0  Albumin, Serum  3.2  Routine Chem:  16-Jul-14 18:50   Glucose, Serum 83  BUN 7  Creatinine (comp) 0.77  Sodium, Serum 137  Potassium, Serum 3.6  Chloride, Serum 104  CO2, Serum 24  Calcium (Total), Serum 8.8  Osmolality (calc) 271  eGFR (African American) >60  eGFR (Non-African American) >60 (eGFR values <56m/min/1.73 m2 may be an indication of chronic kidney disease (CKD). Calculated eGFR is useful in patients with stable renal function. The eGFR calculation will not be reliable  in acutely ill patients when serum creatinine is changing rapidly. It is not useful in  patients on dialysis. The eGFR calculation may not be applicable to patients at the low and high extremes of body sizes, pregnant women, and vegetarians.)  Anion Gap 9  Routine UA:  16-Jul-14 18:50   Color (UA) Yellow  Clarity (UA) Clear  Glucose (UA) Negative  Bilirubin (UA) Negative  Ketones (UA) Trace  Specific Gravity (UA) 1.008  Blood (UA) 3+  pH (UA) 6.0  Protein (UA) Negative  Nitrite (UA) Negative  Leukocyte Esterase (UA) 2+ (Result(s) reported on 23 Sep 2012 at 07:41PM.)  RBC (UA) 9 /HPF  WBC (UA) 26 /HPF  Bacteria (UA) TRACE  Epithelial Cells (UA) 1 /HPF  Mucous (UA) PRESENT (Result(s) reported on 23 Sep 2012 at 07:41PM.)  Routine Hem:  16-Jul-14 18:50   WBC (CBC) 10.2  RBC (CBC)  3.09  Hemoglobin (CBC)  9.4  Hematocrit (CBC)  27.4  Platelet Count (CBC)  554 (Result(s) reported on 23 Sep 2012 at 07:37PM.)  MCV 89  MCH 30.4  MCHC 34.3  RDW 14.0   Radiology Results: [place]    09-Jul-14 04:31, [place] Ex[place] with Transvaginal - NON-OB  [place] Ultrasound Exam with Transvaginal - NON-OB  REASON FOR EXAM:    worsening vaginl bleeding with concern for retained   products  COMMENTS:       PROCEDURE: [place] - [place] EXAM W/TRANSVAGINAL  - Sep 16 2012  4:31AM     RESULT: Transabdominal and endovaginal pelvic sonogram is performed. The   patient has previous pelvic sonogram dated 30 Jul 2010.    The uterus measures 16.08 x 12.01 x 8.17 cm. The patient is 3 days post   partum accounting for the size the uterus. The endometrial stripe   thickness is 4.07 cm. The ovaries could not be seen. There is no free   fluid or abnormal fluid collection within the pelvic region otherwise.   The endovascular images show the endometrial space contains some   increased vasculature and echogenic material measuring 4.42 x 3.7 x 5.47     cm.    IMPRESSION:  Enlarged uterus with an  abnormal appearance of the   endometrium. The possibility of clotted hemorrhage or retained products   of conception should be considered. Gynecologic followup is recommended.    Dictation Site: 1        Verified By: GSundra Aland M.D., MD    17-Jul-14 03:49, [place] - NON OB  [place]vis - NON OB  REASON FOR EXAM:    vaginal bleeding s/p vaginal delivery  COMMENTS:       PROCEDURE: [place] - [place] EXAM  - Sep 24 2012  3:49AM     RESULT: History: Postpartum bleeding.    Comparison Study: Prior pelvic ultrasound 09/16/2012.     Findings: Uterus measures 12.5  x 7.2 x 8.8 cm. Patient is postpartum.   Endometrial thickness to 3.1 cm present. Large complex density within the   endometrial canal measuring up to 6 cm in maximum diameter noted. This   could represent retained products of conception.Ovaries not visualized.   No hydronephrosis.  IMPRESSION:  Persistent initial canal large complex density suggesting   retained products of conception and /or blood clots.        Verified By: Osa Craver, M.D., MD    Assessment/Admission Diagnosis 29 yo [phone] who is postpartum day #12 s/p spontaneous vaginal delivery complicated by shoulder dystocia and acute blood loss anemia with no clear single event where she had significant blood loss apart from ongoing RN documentation of heavy lochia with clots.  She was admitted one week ago for delayed post-partum hemorrhage for medical management and observation.  She has had continued mild-moderate bleeding over the past week and began passing small clots yesterday.   Plan I had a very long discussion with this patient regarding potential treatments at this time.  I discussed the only definitive treatment being D&C.  We discussed the risks/benefits in great detail.  As an alternative, and given that her bleeding is in the normal range of post-partum bleeding at this point and her blood counts have not only remained stable - they have  increased appropriately, one could consider to continue to monitor as an outpatient.  The measurement of the clot vs. retained POC in her uterus has increased slightly.  She is hesitant to undergo surgery at this time and understands that she could pass large clots to pass what is in her uterus.  I spent about 30 minutes in direct, face-to-face time with her educating her on normal menstrual bleeding and undertaking an informed consent/refusal process for D&C.  I explained that there are some physicians who would strongly recommend D&C and that would be a very reasonable path to take.  There are other physicians who would be very hesitant to take a person who is so clinically stable to the operating room.  After a lengthy discussion she would like to continue to monitor her bleeding as an outpatient.  She understands that no guarantees can be made about her future need for surgery.  She does acknowledge this fact.  Given all of the above, I will send her home.   Electronic Signatures: Will Bonnet (MD)  (Signed 17-Jul-14 10:05)  Authored: CHIEF COMPLAINT and HISTORY, PAST MEDICAL/SURGIAL HISTORY, ALLERGIES, HOME MEDICATIONS, FAMILY AND SOCIAL HISTORY, REVIEW OF SYSTEMS, PHYSICAL EXAM, LABS, Radiology, ASSESSMENT AND PLAN   Last Updated: 17-Jul-14 10:05 by Will Bonnet (MD)

## 2014-07-10 NOTE — Consult Note (Signed)
PATIENT NAME:  Jessica Wong, Jessica Wong MR#:  [id number] DATE OF BIRTH:  1986/02/08  DATE OF CONSULTATION:  05/04/2014  CONSULTING PHYSICIAN:  Jessica Amel, MD  IDENTIFYING INFORMATION AND REASON FOR CONSULT: This is a 29 year old woman here because of serious suicidal ideation.   CHIEF COMPLAINT: "I cannot stop thinking about it."   HISTORY OF PRESENT ILLNESS: The patient states that while she has always had problems with depression it had been relatively manageable until recently. About two weeks ago she was started on two medications at the same time. She was started on thyroid supplement and also a new birth control pill. Since that time her mood has become desperately worse. She feels sad and tearful the time. She is feeling like she needs to sleep all the time. Has no appetite. She found herself thinking about jumping off of a bridge to kill her today and realized that she was not her normal self. She denies that she is having any hallucinations. She denies that she is abusing any drugs or alcohol. Not currently getting any psychiatric treatment. Major stress is her chronic pelvic pain from ovarian cysts.   PAST PSYCHIATRIC HISTORY: The patient has been on several antidepressants in the past including Zoloft and Lexapro and also been prescribed Tegretol, but thought that none of them were helpful. She has never had inpatient treatment in the past. No serious suicide attempts in the past. She says that she has had problems with depression and sadness all of her life.   SOCIAL HISTORY: The patient is married and has two biological children as well as is raising one niece. She works nights as a Arboriculturist. Her relationship with her husband is overall good, but he is completely unsupportive of the idea of depression.   PAST MEDICAL HISTORY: Just recently was told that she had thyroid disease and that she needed to take thyroid medication. Also has ovarian cysts and has chronic pain related to them.    FAMILY HISTORY: Very significant for two brothers who committed suicide.   SUBSTANCE ABUSE HISTORY: Denies any drinking or drug use.   CURRENT MEDICATIONS: The patient does not know what the birth control pill was; says that she was just started on thyroid medication and did not know the dosage. According to the  medicine reconciliation it was 75 mg of levothyroxine.   ALLERGIES: Anti-inflammatories, Bactrim, Biaxin, and Flagyl.   REVIEW OF SYSTEMS:  Sad.  Tearful. Suicidal ideation. Sleepy. Hopeless.   MENTAL STATUS EXAMINATION: Reasonably well-groomed woman looks her stated age, cooperative with the interview. Good eye contact. Psychomotor activity normal. Speech was normal in rate, tone, and volume. The patient's affect was remarkably tearful. Sobbing throughout much of the interview, very down. Mood stated as depressed. Thoughts are generally lucid but at times she gets overwhelmed by negative thoughts about herself. She denies that she has acute suicidal intent, but had serious suicidal ideation with a specific plan yesterday. No homicidal ideation. She is alert and oriented x4. She remembers three objects immediately; two out of three at three minutes. Judgment and insight are reasonably OK. Intelligence normal.   VITAL SIGNS: Currently, blood pressure 140/79, respirations 18, pulse 78, temperature 97.8.   LABORATORY RESULTS: Chemistry panel is all normal. Alcohol level is negative. Drug screen is positive for opiates and benzodiazepines. Hematology panel is all normal. Urinalysis unremarkable. Acetaminophen and salicylates nontoxic.   ASSESSMENT: A 29 year old woman with a history of depression who has had 1 to 2 weeks of severe worsening of  her mood. She wants to blame it on the thyroid supplement. To me it sounds like it is likely actually more related to the birth control pill. She had denied to me using any drugs of abuse or being on any other medications so I am not sure where the  opiates and benzodiazepines are coming from or what relationship they play to the condition. The patient's needs hospitalization for safety.   TREATMENT PLAN: Admit to psychiatry. Suicide precautions in place. We will not continue any of her outpatient medicines, but only p.r.n. Ativan and primary treatment team can work on further evaluation.   DIAGNOSIS, PRINCIPAL AND PRIMARY:  AXIS I: Major depression, severe, recurrent.   SECONDARY DIAGNOSES:  AXIS I: No further.  AXIS II: Deferred.  AXIS III: Chronic pain, hypothyroid.    ____________________________ Jessica AmelJohn T. Collier Monica, MD jtc:at D: 05/04/2014 18:17:14 ET T: 05/04/2014 18:39:10 ET JOB#: [id number]  cc: Jessica AmelJohn T. Aaira Oestreicher, MD, <Dictator> Jessica AmelJOHN T Jesilyn Easom MD ELECTRONICALLY SIGNED 05/17/2014 10:36

## 2014-07-19 NOTE — H&P (Signed)
L&D Evaluation:  History:  HPI 29 yo G2P1001 [facility] presents with LUQ abdominal pain for the last two days.  The pain has not changed in intensity/location, it does not radiate.  Initially felt the pain was related to heart burn but did not get better with tums or milk.  She developed nausea, and susbequently has had six episodes of emesis since 2100 yesterday. Emesis describes as green, no hematemsis or coffee ground appearance.  Denies fevers, chills, diarrhea, constipation.  +FM, no LOF, no VB, no ctx.  [facility] unremarkable to date[facility]   Presents with abdominal pain   Patient's Medical History Asthma   Patient's Surgical History none   Medications Pre Natal Vitamins  albuterol. pulmicort   Allergies Bactrim   Social History none   Family History Non-Contributory   ROS:  ROS see HPI   Exam:  Vital Signs stable   Urine Protein UA pending   General no apparent distress   Mental Status clear   Chest no increased work of breathing   Heart normal sinus rhythm   Abdomen NABS, soft, gravid but otherwise non-distended, mild LUQ/epigastric pain reported by  patient on exam, no rebound, no guarding   Estimated Fetal Weight Average for gestational age   Back no CVAT   Edema no edema   FHT 160, moderate, no accles, no decels   Ucx absent   Impression:  Impression 29 yo G2P1001 at 167w0d presenting with LUQ abdominal pain   Plan:  Comments 1) Abdominal pain/nausea - suspect viral gastroenteritis.      - Zofran ODT 8mg  po     - CBC, CMP, lipase     - If improvment in nausea and normal labs will po callange   2) Fetus - reassuring fetal surveillance, strip appropriate for 27 weeks   Electronic Signatures for Addendum Section:  Orvan FalconerStansbury Clipp, Eryn K (MD) (Signed Addendum 12-Apr-14 09:50)  Pt subjectively feeling better. Tol clears.  Labs returned WNL.  Rx given for famotidine BID as symptoms are likely c/w GERD.   F/u as scheduled.  Discharge home with precautions.   Electronic Signatures: Lorrene ReidStaebler, Neyda Durango M (MD)  (Signed 12-Apr-14 07:17)  Authored: L&D Evaluation   Last Updated: 12-Apr-14 09:50 by Garnette GunnerStansbury Clipp, Ali LoweEryn K (MD)

## 2014-07-19 NOTE — H&P (Signed)
L&D Evaluation:  History Expanded:  HPI 29 yo G2P1001 [facility] presents in labor. OPOS , VI, RUBI, mild shoulder dystocia last delivery, plans to BF,   Gravida 2   Term 1   PreTerm 0   Abortion 0   Living 1   Blood Type (Maternal) O positive   Group B Strep Results Maternal (Result >5wks must be treated as unknown) negative   Maternal HIV Negative   Maternal Syphilis Ab Nonreactive   Maternal Varicella Immune   Rubella Results (Maternal) immune   Maternal T-Dap Immune   [facility]   Presents with contractions   Patient's Medical History Asthma   Patient's Surgical History torn ACL   Medications Pre Natal Vitamins   Allergies Sulfa   Social History tobacco   Family History Non-Contributory   ROS:  ROS All systems were reviewed.  HEENT, CNS, GI, GU, Respiratory, CV, Renal and Musculoskeletal systems were found to be normal.   Exam:  Vital Signs stable   Urine Protein not completed   General no apparent distress   Mental Status clear   Chest clear   Heart normal sinus rhythm   Abdomen gravid, tender with contractions   Estimated Fetal Weight Average for gestational age   Fetal Position v   Back no CVAT   Pelvic no external lesions   Mebranes Intact   FHT normal rate with no decels   FHT Description cat 1 .   Fetal Heart Rate 140   Ucx irregular   Skin dry   Lymph no lymphadenopathy   Impression:  Impression early labor   Plan:  Plan monitor contractions and for cervical change   Follow Up Appointment need to schedule. in 6 weeks. wsob   Electronic Signatures: Adria DevonKlett, Keon Pender (MD)  (Signed 06-Jul-14 09:59)  Authored: L&D Evaluation   Last Updated: 06-Jul-14 09:59 by Adria DevonKlett, Keiasha Diep (MD)

## 2014-07-19 NOTE — H&P (Signed)
L&D Evaluation:  History Expanded:  HPI 29 yo G2P1001 with EDD of 09/19/2012 presents with c/o LBP and abd pain with intermittant contractions, reports cervix was 4-5 cm at office yesterday. +FM, no LOF, no VB.   PNC unremarkable to date, [facility]   Presents with abdominal pain   Patient's Medical History Asthma   Patient's Surgical History none   Medications Pre Natal Vitamins  albuterol. pulmicort   Allergies Bactrim   Social History none   Family History Non-Contributory   ROS:  ROS see HPI   Exam:  Vital Signs stable   General no apparent distress   Mental Status clear   Abdomen gravid, non-tender   Back no CVAT   Edema no edema   Pelvic no external lesions, 4-5/80/-2, posterior - unchanged x 4 hrs   FHT normal rate with no decels, category 1 before morphine dosing   Ucx occasional   Impression:  Impression IUP at term, not in labor   Plan:  Comments Pt was monitored x 4 hours, given dose of Morphine 10 mg IM. Reports some relief of sx, but not fully resolved. Comfort measures discussed.   Follow Up Appointment already scheduled. on 7/3   Electronic Signatures: Vella KohlerBrothers, Primitivo Merkey K (CNM)  (Signed 27-Jun-14 08:16)  Authored: L&D Evaluation   Last Updated: 27-Jun-14 08:16 by Vella KohlerBrothers, Dorien Mayotte K (CNM)

## 2014-07-19 NOTE — H&P (Signed)
PATIENT NAME:  Jessica Wong, Jessica Wong [id number] OF BIRTH:  [birth year] OF ADMISSION:  05/04/2014 INFORMATION: This is a 29 year old woman, married, employed as part of a Solicitor.  Patient lives in [place]: "I cannot stop thinking about it."  OF PRESENT ILLNESS: The patient states that while she has always had problems with depression (since 2 brothers committed suicide) it had been relatively manageable until recently. About two weeks ago she was started on two medications at the same time. She was started on thyroid supplement and also a new birth control pill. Since that time her mood has become desperately worse. She feels sad and tearful the time. She is feeling like she needs to sleep all the time. Has no appetite. She found herself thinking about jumping off of a bridge to kill her today and realized that she was not her normal self. She denies that she is having any hallucinations. She denies that she is abusing any drugs or alcohol. Not currently getting any psychiatric treatment. Major stress is her chronic pelvic pain from ovarian cysts.   She describes this pain as severe and constant for the last 1  years.  Today she grades it as an 8 out 10.assessment patient was very tearful and agitated. denies having any psychotic symptomsdenies any history of hypomanic or manic symptoms.reports having a diagnosis of OCD 3 years ago.  Treated at [facility]. Pt reports vague repetitive checking of doors, mainly refrigerator door.  Denies having issues with repetitive behaviors or thoughts now.abuse: denies use of alcohol, or illicit drug use.  Denies smoking cigarettes. denies witnessing or suffering from any traumatic events. PSYCHIATRIC HISTORY: The patient has been on several antidepressants in the past including Zoloft, Lexapro and prozac also been prescribed Tegretol, but thought that none of them were helpful and made her anxiety worse. She fears antidepressants and refuses to take any as one of  her brother committed suicide while taking antidepressants. She has never had inpatient treatment in the past. No suicide attempts in the past. She says that she has had problems with depression and sadness all of her life.  MEDICAL HISTORY: Just recently was told that she had thyroid disease and that she needed to take thyroid medication. Also has ovarian cysts and has chronic pain related to them. was started a week ago on synthroid 36mg.  [facility]  Pt was started also about 1 week ago on oral contraceptives.  Patient says she was seen in our ER 1-2 weeks ago for pain.   Says she had an [place]ore[place]hich showed ovarian cyst.  She was given an opioid.  [facility] HISTORY: Very significant for two brothers who committed suicide.  Older brother overdose at age 30  A younger brother shot himself.  Another brother suffers from substance abuse and is in jail. Her father had substance abuse issues.  He died of lung cancer in 22011-03-05  HISTORY: The patient is married and has two biological children as well as is raising one niece. She works nights as a cSports coach Her relationship with her husband is overall good, but he is completely unsupportive of the idea of depression.  MEDICATIONS: The patient does not know what the birth control pill was; says that she was just started on thyroid medication and did not know the dosage. According to the medicine reconciliation it was 75 mg of levothyroxine.  ALLERGIES: Anti-inflammatories, Bactrim, Biaxin, and Flagyl.  OF SYSTEMS:  pelvic cramps of moderate to severe level.  No other complaints.  The rest of the 10 review of systems is negative.  STATUS EXAMINATION: Reasonably well-groomed woman looks her stated age, cooperative with the interview. Good eye contact. Psychomotor activity normal. Speech was normal in rate, tone, and volume. The patient's affect was remarkably  tearful. Sobbing throughout much of the interview, very down. Mood stated as depressed. Thoughts are generally lucid but at times she gets overwhelmed by negative thoughts about herself. She denies SI today. No homicidal ideation. She is alert and oriented x4. She remembers three objects immediately; two out of three at three minutes. Judgment and insight are reasonably fair. Intelligence normal.  EXAMINATION:    25-Feb-16 06:15   Vital Signs Type: Routine   Temperature Temperature (F): 98.7   Celsius: 37   Pulse Pulse: 76   Respirations Respirations: 20   Systolic BP Systolic BP: 569   Diastolic BP (mmHg) Diastolic BP (mmHg): 93   Pulse Pulse Sitting: 76   Systolic BP Systolic BP: 794   Diastolic BP (mmHg) Diastolic BP (mmHg): 86   Pulse Standing Pulse Standing: 52 General: 29 y/o female obese in no acute distressnormal gait and muscular tone, no evidence of involuntary movements.    Labs:  Lab Results: Thyroid:  25-Feb-16 09:54   Thyroid Stimulating Hormone 0.485 (0.45-4.50= International Unit) -----------------------patients have reference for TSH: - - - - - - - - - - first trimetser: 0.36 - 2.50 uIU/mL)  Hepatic:  24-Feb-16 01:21   Bilirubin, Total 0.3  Alkaline Phosphatase 101  SGPT (ALT) 27  SGOT (AST) 28  Total Protein, Serum 7.2  Albumin, Serum 3.8  General Ref:  24-Feb-16 01:21   Acetaminophen, Serum  2 (10-30TOXIC: > 200 mcg/mL > 50 mcg/mL at 12 hr after ingestion > 300 mcg/mL at 4 hr after ingestion)  Salicylates, Serum 1.9 (0.0-2.82.8-20.0 mg/dL>30.0 mg/dL)  Routine Chem:  24-Feb-16 01:21   Glucose, Serum 92  BUN 11  Creatinine (comp) 0.93  Sodium, Serum 139  Potassium, Serum 3.5  Chloride, Serum 105  CO2, Serum 28  Calcium (Total), Serum 8.7  Osmolality (calc) 277  eGFR (African American) >60  eGFR (Non-African American) >60 (eGFR values <66m/min/1.73 m2 may be an indication of chronicdisease (CKD).eGFR, using the MRDR Study equation, is  useful in with stable renal function.eGFR calculation will not be reliable in acutely ill patientsserum creatinine is changing rapidly. It is not useful inon dialysis. The eGFR calculation may not be applicablepatients at the low and high extremes of body sizes, pregnantand vegetarians.)  Anion Gap  6  Ethanol, S. < 3 (Result(s) reported on 04 May 2014 at 01:51AM.)  Urine Drugs:  [id number]  Tricyclic Antidepressant, Ur Qual (comp) NEGATIVE (Result(s) reported on 04 May 2014 at 01:53AM.)  Amphetamines, Urine Qual. NEGATIVE  MDMA, Urine Qual. NEGATIVE  Cocaine Metabolite, Urine Qual. NEGATIVE  Opiate, Urine qual POSITIVE  Phencyclidine, Urine Qual. NEGATIVE  Cannabinoid, Urine Qual. NEGATIVE  Barbiturates, Urine Qual. NEGATIVE  Benzodiazepine, Urine Qual. POSITIVE (-----------------URINE DRUG SCREEN provides only a preliminary, unconfirmedtest result and should not be used for non-medical  Clinical consideration and professional judgment should be to any positive drug screen result due to possiblesubstances.  A more specific alternate chemical methodbe used in order to obtain a confirmed analytical result.  Gasspectrometry (GC/MS) is the preferredmethod.)  Methadone, Urine Qual. NEGATIVE  Routine UA:  24-Feb-16 01:21   Color (UA) Colorless  Clarity (UA) Clear  Glucose (UA) Negative  Bilirubin (UA) Negative  Ketones (UA) Negative  Specific Gravity (UA) 1.002  Blood (UA) 1+  pH (UA) 6.0  Protein (UA) Negative  Nitrite (UA) Negative  Leukocyte Esterase (UA) Negative (Result(s) reported on 04 May 2014 at 01:48AM.)  RBC (UA) NONE SEEN  WBC (UA) <1 /HPF  Bacteria (UA) NONE SEEN  Epithelial Cells (UA) 2 /HPF (Result(s) reported on 04 May 2014 at 01:48AM.)  Routine Hem:  24-Feb-16 01:21   WBC (CBC) 9.4  RBC (CBC) 4.04  Hemoglobin (CBC) 12.1  Hematocrit (CBC) 35.7  Platelet Count (CBC) 367 (Result(s) reported on 04 May 2014 at 01:38AM.)  MCV 88  MCH 29.9  MCHC 33.8  RDW 12.8    Major depressive disorder recurrent moderatepelvic pain A 29 year old woman with a history of depression who has had 1 to 2 weeks of severe worsening of her mood. Patient believes the med medications which were started at [facility] (synthroid and oral contraceptive) are causing the worsening in mood.  Pt also feels that chronic pain has played a significant role in worsening her mood. tox + for opioids and benzos: pt says she received a prescription for opiods from our ER 1-2 weeks ago.  No notes found in our EMR.  Does not know why her urine is + for benzos (she could have received Ativan in the ER). PLAN:  pt declines from receiving antidepressantswill start Haldol 0.5 mg tid and vistaril 50 mg tid prnpain: will start Mobic 15 mg po q daysynthroid has been d/cwill check TSH and Vit b 12 -Discharge planning: when stable will d/c back to her home with family.   Electronic Signatures: Hildred Priest (MD)  (Signed on 25-Feb-16 12:09)  Authored  Last Updated: 25-Feb-16 12:09 by Hildred Priest (MD)

## 2014-08-01 DIAGNOSIS — K2970 Gastritis, unspecified, without bleeding: Secondary | ICD-10-CM | POA: Diagnosis not present

## 2014-08-01 DIAGNOSIS — M549 Dorsalgia, unspecified: Secondary | ICD-10-CM | POA: Insufficient documentation

## 2014-08-01 DIAGNOSIS — R1012 Left upper quadrant pain: Secondary | ICD-10-CM | POA: Diagnosis present

## 2014-08-01 DIAGNOSIS — R51 Headache: Secondary | ICD-10-CM | POA: Insufficient documentation

## 2014-08-01 NOTE — ED Notes (Signed)
Pt in with co luq pain x 1 month, no n.v.d. Or dysuria.

## 2014-08-02 ENCOUNTER — Emergency Department: Payer: Medicaid Other

## 2014-08-02 ENCOUNTER — Emergency Department
Admission: EM | Admit: 2014-08-02 | Discharge: 2014-08-02 | Disposition: A | Discharge status: Home / Self Care | Payer: Medicaid Other | Attending: Emergency Medicine | Admitting: Emergency Medicine

## 2014-08-02 DIAGNOSIS — K2970 Gastritis, unspecified, without bleeding: Secondary | ICD-10-CM

## 2014-08-02 DIAGNOSIS — R1012 Left upper quadrant pain: Secondary | ICD-10-CM

## 2014-08-02 LAB — CBC WITH DIFFERENTIAL/PLATELET
Basophils Absolute: 0.1 10*3/uL (ref 0–0.1)
Basophils Relative: 1 %
Eosinophils Absolute: 0.2 10*3/uL (ref 0–0.7)
Eosinophils Relative: 2 %
HCT: 36.5 % (ref 35.0–47.0)
Hemoglobin: 12.3 g/dL (ref 12.0–16.0)
Lymphocytes Relative: 19 %
Lymphs Abs: 2.6 10*3/uL (ref 1.0–3.6)
MCH: 29.6 pg (ref 26.0–34.0)
MCHC: 33.6 g/dL (ref 32.0–36.0)
MCV: 88.2 fL (ref 80.0–100.0)
MONO ABS: 0.6 10*3/uL (ref 0.2–0.9)
MONOS PCT: 4 %
Neutro Abs: 9.9 10*3/uL — ABNORMAL HIGH (ref 1.4–6.5)
Neutrophils Relative %: 74 %
Platelets: 348 10*3/uL (ref 150–440)
RBC: 4.14 MIL/uL (ref 3.80–5.20)
RDW: 12.9 % (ref 11.5–14.5)
WBC: 13.3 10*3/uL — ABNORMAL HIGH (ref 3.6–11.0)

## 2014-08-02 LAB — COMPREHENSIVE METABOLIC PANEL
ALK PHOS: 96 U/L (ref 38–126)
ALT: 23 U/L (ref 14–54)
ANION GAP: 7 (ref 5–15)
AST: 18 U/L (ref 15–41)
Albumin: 4.2 g/dL (ref 3.5–5.0)
BILIRUBIN TOTAL: 0.1 mg/dL — AB (ref 0.3–1.2)
BUN: 11 mg/dL (ref 6–20)
CALCIUM: 9.1 mg/dL (ref 8.9–10.3)
CO2: 27 mmol/L (ref 22–32)
Chloride: 104 mmol/L (ref 101–111)
Creatinine, Ser: 0.84 mg/dL (ref 0.44–1.00)
GFR calc Af Amer: >60 mL/min (ref >60)
Glucose, Bld: 136 mg/dL — ABNORMAL HIGH (ref 65–99)
POTASSIUM: 3.4 mmol/L — AB (ref 3.5–5.1)
Sodium: 138 mmol/L (ref 135–145)
Total Protein: 7.3 g/dL (ref 6.5–8.1)

## 2014-08-02 LAB — LIPASE, BLOOD: Lipase: 32 U/L (ref 22–51)

## 2014-08-02 LAB — URINALYSIS COMPLETE WITH MICROSCOPIC (ARMC ONLY)
Bacteria, UA: NONE SEEN
Bilirubin Urine: NEGATIVE
Glucose, UA: NEGATIVE mg/dL
HGB URINE DIPSTICK: NEGATIVE
Ketones, ur: NEGATIVE mg/dL
Nitrite: NEGATIVE
PH: 5 (ref 5.0–8.0)
Protein, ur: NEGATIVE mg/dL
SPECIFIC GRAVITY, URINE: 1.011 (ref 1.005–1.030)

## 2014-08-02 LAB — POCT PREGNANCY, URINE: Preg Test, Ur: NEGATIVE

## 2014-08-02 MED ORDER — SUCRALFATE 1 G PO TABS: 1.0000 g | ORAL_TABLET | Freq: Four times a day (QID) | ORAL | Status: DC

## 2014-08-02 MED ORDER — FAMOTIDINE 40 MG PO TABS: 40.0000 mg | ORAL_TABLET | Freq: Every evening | ORAL | Status: DC

## 2014-08-02 MED ORDER — GI COCKTAIL ~~LOC~~
30.0000 mL | Freq: Once | ORAL | Status: AC
  Administered 2014-08-02: 30 mL via ORAL

## 2014-08-02 MED ORDER — FAMOTIDINE 20 MG PO TABS
40.0000 mg | ORAL_TABLET | Freq: Once | ORAL | Status: AC
  Administered 2014-08-02: 40 mg via ORAL
  Filled 2014-08-02: qty 2

## 2014-08-02 MED ORDER — SUCRALFATE 1 G PO TABS
1.0000 g | ORAL_TABLET | Freq: Once | ORAL | Status: AC
  Administered 2014-08-02: 1 g via ORAL

## 2014-08-02 MED ORDER — FAMOTIDINE IN NACL 20-0.9 MG/50ML-% IV SOLN: 20.0000 mg | Freq: Once | INTRAVENOUS | Status: DC

## 2014-08-02 MED ORDER — GI COCKTAIL ~~LOC~~
ORAL | Status: AC
  Administered 2014-08-02: 30 mL via ORAL
  Filled 2014-08-02: qty 30

## 2014-08-02 MED ORDER — SUCRALFATE 1 G PO TABS
ORAL_TABLET | ORAL | Status: AC
  Administered 2014-08-02: 1 g via ORAL
  Filled 2014-08-02: qty 1

## 2014-08-02 NOTE — ED Notes (Signed)
Pt uprite on stretcher in exam room with no distress noted; st last mo having left upper quad pain with no accomp symptoms; denies hx of same

## 2014-08-02 NOTE — Discharge Instructions (Signed)
Gastritis, Adult  Gastritis is soreness and swelling (inflammation) of the lining of the stomach. Gastritis can develop as a sudden onset (acute) or long-term (chronic) condition. If gastritis is not treated, it can lead to stomach bleeding and ulcers.  CAUSES   Gastritis occurs when the stomach lining is weak or damaged. Digestive juices from the stomach then inflame the weakened stomach lining. The stomach lining may be weak or damaged due to viral or bacterial infections. One common bacterial infection is the Helicobacter pylori infection. Gastritis can also result from excessive alcohol consumption, taking certain medicines, or having too much acid in the stomach.   SYMPTOMS   In some cases, there are no symptoms. When symptoms are present, they may include:   Pain or a burning sensation in the upper abdomen.   Nausea.   Vomiting.   An uncomfortable feeling of fullness after eating.  DIAGNOSIS   Your caregiver may suspect you have gastritis based on your symptoms and a physical exam. To determine the cause of your gastritis, your caregiver may perform the following:   Blood or stool tests to check for the H pylori bacterium.   Gastroscopy. A thin, flexible tube (endoscope) is passed down the esophagus and into the stomach. The endoscope has a light and camera on the end. Your caregiver uses the endoscope to view the inside of the stomach.   Taking a tissue sample (biopsy) from the stomach to examine under a microscope.  TREATMENT   Depending on the cause of your gastritis, medicines may be prescribed. If you have a bacterial infection, such as an H pylori infection, antibiotics may be given. If your gastritis is caused by too much acid in the stomach, H2 blockers or antacids may be given. Your caregiver may recommend that you stop taking aspirin, ibuprofen, or other nonsteroidal anti-inflammatory drugs (NSAIDs).  HOME CARE INSTRUCTIONS   Only take over-the-counter or prescription medicines as directed by  your caregiver.   If you were given antibiotic medicines, take them as directed. Finish them even if you start to feel better.   Drink enough fluids to keep your urine clear or pale yellow.   Avoid foods and drinks that make your symptoms worse, such as:   Caffeine or alcoholic drinks.   Chocolate.   Peppermint or mint flavorings.   Garlic and onions.   Spicy foods.   Citrus fruits, such as oranges, lemons, or limes.   Tomato-based foods such as sauce, chili, salsa, and pizza.   Fried and fatty foods.   Eat small, frequent meals instead of large meals.  SEEK IMMEDIATE MEDICAL CARE IF:    You have black or dark red stools.   You vomit blood or material that looks like coffee grounds.   You are unable to keep fluids down.   Your abdominal pain gets worse.   You have a fever.   You do not feel better after 1 week.   You have any other questions or concerns.  MAKE SURE YOU:   Understand these instructions.   Will watch your condition.   Will get help right away if you are not doing well or get worse.  Document Released: 02/19/2001 Document Revised: 08/27/2011 Document Reviewed: 04/10/2011  Exit[employer] Patient Information 2015 [employer]. This information is not intended to replace advice given to you by your health care provider. Make sure you discuss any questions you have with your health care provider.  Abdominal Pain  Many things can cause abdominal pain. Usually,   abdominal pain is not caused by a disease and will improve without treatment. It can often be observed and treated at home. Your health care provider will do a physical exam and possibly order blood tests and X-rays to help determine the seriousness of your pain. However, in many cases, more time must pass before a clear cause of the pain can be found. Before that point, your health care provider may not know if you need more testing or further treatment.  HOME CARE INSTRUCTIONS   Monitor your abdominal pain for any changes. The  following actions may help to alleviate any discomfort you are experiencing:   Only take over-the-counter or prescription medicines as directed by your health care provider.   Do not take laxatives unless directed to do so by your health care provider.   Try a clear liquid diet (broth, tea, or water) as directed by your health care provider. Slowly move to a bland diet as tolerated.  SEEK MEDICAL CARE IF:   You have unexplained abdominal pain.   You have abdominal pain associated with nausea or diarrhea.   You have pain when you urinate or have a bowel movement.   You experience abdominal pain that wakes you in the night.   You have abdominal pain that is worsened or improved by eating food.   You have abdominal pain that is worsened with eating fatty foods.   You have a fever.  SEEK IMMEDIATE MEDICAL CARE IF:    Your pain does not go away within 2 hours.   You keep throwing up (vomiting).   Your pain is felt only in portions of the abdomen, such as the right side or the left lower portion of the abdomen.   You pass bloody or black tarry stools.  MAKE SURE YOU:   Understand these instructions.    Will watch your condition.    Will get help right away if you are not doing well or get worse.   Document Released: 12/05/2004 Document Revised: 03/02/2013 Document Reviewed: 11/04/2012  [employer] Patient Information 2015 [employer]. This information is not intended to replace advice given to you by your health care provider. Make sure you discuss any questions you have with your health care provider.

## 2014-08-02 NOTE — ED Notes (Signed)
Negative POCT

## 2014-08-02 NOTE — ED Provider Notes (Signed)
[facility] Emergency Department Provider Note  ____________________________________________  Time seen: Approximately 4:09 AM  I have reviewed the triage vital signs and the nursing notes.   HISTORY  Chief Complaint Abdominal Pain    HPI Grant FontanaJuanita D Middleton is a 29 y.o. female who comes in with left upper quadrant pain. The patient reports that she has had this pain for a while, over one month but reports it is been getting worse over the last 3 days. The patient reports is a constant sharp and achy pain with occasional sharp pains. She reports that if she lays flat the pain is worse. She has been taking ibuprofen taken warm showers and laying on heating pads to help the pain but it has not gotten any better. The patient reports the pain is into her back as well. It is a 7-8 out of 10 in intensity. The patient denies any burning or any change with specific foods. The patient has not had any fevers or vomiting but has felt nauseous. She has also had some diarrhea as well. The patient reports that she's been unable to sleep so she came in for further evaluation. She reports that whenever she has tried nothing has helped the pain to go away and is a constant daily pain.   No past medical history on file. Hypothyroid There are no active problems to display for this patient.   No past surgical history on file.  Current Outpatient Rx  Name  Route  Sig  Dispense  Refill  . famotidine (PEPCID) 40 MG tablet   Oral   Take 1 tablet (40 mg total) by mouth every evening.   30 tablet   1   . sucralfate (CARAFATE) 1 G tablet   Oral   Take 1 tablet (1 g total) by mouth 4 (four) times daily.   60 tablet   1    Levothyroxine Allergies Flagyl and Bactrim  No family history on file.  Social History History  Substance Use Topics  . Smoking status: Not on file  . Smokeless tobacco: Not on file  . Alcohol Use: Not on file    Review of Systems Constitutional: No  fever/chills Eyes: No visual changes. ENT: No sore throat. Cardiovascular: Denies chest pain. Respiratory: Denies shortness of breath. Gastrointestinal: Abdominal pain, nausea, diarrhea Genitourinary: Negative for dysuria. Musculoskeletal:  back pain. Skin: Negative for rash. Neurological: Headache  10-point ROS otherwise negative.  ____________________________________________   PHYSICAL EXAM:  VITAL SIGNS: ED Triage Vitals  Enc Vitals Group     BP 08/01/14 2326 139/80 mmHg     Pulse Rate 08/01/14 2326 88     Resp 08/01/14 2326 18     Temp 08/01/14 2326 98.2 F (36.8 C)     Temp Source 08/01/14 2326 Oral     SpO2 08/01/14 2326 100 %     Weight 08/01/14 2326 220 lb (99.791 kg)     Height 08/01/14 2326 5\' 4"  (1.626 m)     Head Cir --      Peak Flow --      Pain Score 08/01/14 2326 8     Pain Loc --      Pain Edu? --      Excl. in GC? --     Constitutional: Alert and oriented. Well appearing and in mild distress. Eyes: Conjunctivae are normal. PERRL. EOMI. Head: Atraumatic. Nose: No congestion/rhinnorhea. Mouth/Throat: Mucous membranes are moist.  Oropharynx non-erythematous. Cardiovascular: Normal rate, regular rhythm. Grossly normal heart  sounds.  Good peripheral circulation. Respiratory: Normal respiratory effort.  No retractions. Lungs CTAB. Gastrointestinal: Soft, tenderness in left upper quadrant with no CVA tenderness. Genitourinary: Deferred Musculoskeletal: No lower extremity tenderness nor edema.   Neurologic:  Normal speech and language. No gross focal neurologic deficits are appreciated.  Skin:  Skin is warm, dry and intact. No rash noted. Psychiatric: Mood and affect are normal. Speech and behavior are normal.  ____________________________________________   LABS (all labs ordered are listed, but only abnormal results are displayed)  Labs Reviewed  CBC WITH DIFFERENTIAL/PLATELET - Abnormal; Notable for the following:    WBC 13.3 (*)    Neutro Abs  9.9 (*)    All other components within normal limits  COMPREHENSIVE METABOLIC PANEL - Abnormal; Notable for the following:    Potassium 3.4 (*)    Glucose, Bld 136 (*)    Total Bilirubin 0.1 (*)    All other components within normal limits  URINALYSIS COMPLETEWITH MICROSCOPIC ([facility])  - Abnormal; Notable for the following:    Color, Urine STRAW (*)    APPearance CLEAR (*)    Leukocytes, UA 1+ (*)    Squamous Epithelial / LPF 0-5 (*)    All other components within normal limits  LIPASE, BLOOD  POC URINE PREG, ED  POCT PREGNANCY, URINE   ____________________________________________  EKG  None ____________________________________________  RADIOLOGY  Chest x-ray: Negative portable chest ____________________________________________   PROCEDURES  Procedure(s) performed: None  Critical Care performed: No  ____________________________________________   INITIAL IMPRESSION / ASSESSMENT AND PLAN / ED COURSE  Pertinent labs & imaging results that were available during my care of the patient were reviewed by me and considered in my medical decision making (see chart for details).  The patient is a 29 year old female who comes in with left upper quadrant pain that has been present for multiple weeks but has been worsening over the last 2 days. While the patient does have an elevated white blood cell count she does not have any free air on her x-ray. There is a thought that the patient may have an ulcer or gastritis. I will give the patient some Carafate a GI cocktail and some Pepcid and reassess her symptoms.  ----------------------------------------- 6:17 AM on 08/02/2014 -----------------------------------------  Patient reports that her pain has improved from the medication given to her previously. The patient will be discharged home to follow-up with GI who can do an endoscopy to determine if the patient has an ulcer or chest gastritis causing her symptoms for these  multiples of weeks. The patient agrees to the plan as stated. ____________________________________________   FINAL CLINICAL IMPRESSION(S) / ED DIAGNOSES  Final diagnoses:  Gastritis  Left upper quadrant pain      Rebecka Apley, MD 08/02/14 [phone]

## 2014-10-13 ENCOUNTER — Encounter: Payer: Self-pay | Admitting: Emergency Medicine

## 2014-10-13 ENCOUNTER — Emergency Department
Admission: EM | Admit: 2014-10-13 | Discharge: 2014-10-13 | Disposition: A | Discharge status: Home / Self Care | Payer: Medicaid Other | Attending: Emergency Medicine | Admitting: Emergency Medicine

## 2014-10-13 DIAGNOSIS — R102 Pelvic and perineal pain: Secondary | ICD-10-CM | POA: Diagnosis present

## 2014-10-13 DIAGNOSIS — Z79899 Other long term (current) drug therapy: Secondary | ICD-10-CM | POA: Diagnosis not present

## 2014-10-13 DIAGNOSIS — N3090 Cystitis, unspecified without hematuria: Secondary | ICD-10-CM

## 2014-10-13 DIAGNOSIS — Z3202 Encounter for pregnancy test, result negative: Secondary | ICD-10-CM | POA: Insufficient documentation

## 2014-10-13 HISTORY — DX: Disorder of thyroid, unspecified: E07.9

## 2014-10-13 LAB — URINALYSIS COMPLETE WITH MICROSCOPIC (ARMC ONLY)
Bilirubin Urine: NEGATIVE
GLUCOSE, UA: NEGATIVE mg/dL
Hgb urine dipstick: NEGATIVE
KETONES UR: NEGATIVE mg/dL
NITRITE: NEGATIVE
PROTEIN: NEGATIVE mg/dL
SPECIFIC GRAVITY, URINE: 1.023 (ref 1.005–1.030)
pH: 5 (ref 5.0–8.0)

## 2014-10-13 LAB — WET PREP, GENITAL
Clue Cells Wet Prep HPF POC: NONE SEEN
TRICH WET PREP: NONE SEEN
Yeast Wet Prep HPF POC: NONE SEEN

## 2014-10-13 LAB — POCT PREGNANCY, URINE: Preg Test, Ur: NEGATIVE

## 2014-10-13 LAB — CHLAMYDIA/NGC RT PCR (ARMC ONLY)
CHLAMYDIA TR: NOT DETECTED
N gonorrhoeae: NOT DETECTED

## 2014-10-13 MED ORDER — NITROFURANTOIN MACROCRYSTAL 100 MG PO CAPS: 100.0000 mg | ORAL_CAPSULE | Freq: Two times a day (BID) | ORAL | Status: DC

## 2014-10-13 MED ORDER — PHENAZOPYRIDINE HCL 200 MG PO TABS: 200.0000 mg | ORAL_TABLET | Freq: Three times a day (TID) | ORAL | Status: DC | PRN

## 2014-10-13 MED ORDER — NAPROXEN 500 MG PO TABS: 500.0000 mg | ORAL_TABLET | Freq: Two times a day (BID) | ORAL | Status: DC

## 2014-10-13 MED ORDER — CEPHALEXIN 500 MG PO CAPS: 500.0000 mg | ORAL_CAPSULE | Freq: Three times a day (TID) | ORAL | Status: DC

## 2014-10-13 NOTE — Discharge Instructions (Signed)

## 2014-10-13 NOTE — ED Provider Notes (Signed)
[facility] Emergency Department Provider Note  ____________________________________________  Time seen: 2:50 PM  I have reviewed the triage vital signs and the nursing notes.   HISTORY  Chief Complaint Pelvic Pain    HPI Jessica Wong is a 29 y.o. female with a history of ovarian cysts that complains of gradual onset of left lower quadrant pain for the past 2 weeks. It's been constant since then although it is waxing and waning in intensity. No nausea vomiting diarrhea. No chest pain shortness breath fever chills dysuria frequency urgency or hematuria. She has been noticing a small amount of vaginal discharge but is not odorous. No vaginal bleeding. She denies any recent sexual activity. Left lower quadrant abdominal pain is nonradiating, feels crampy and sharp, waxing and waning, moderate intensity at present, no aggravating or alleviating factors. No associated symptoms.    Past Medical History  Diagnosis Date  . Thyroid disease     There are no active problems to display for this patient.   History reviewed. No pertinent past surgical history.  Current Outpatient Rx  Name  Route  Sig  Dispense  Refill  . famotidine (PEPCID) 40 MG tablet   Oral   Take 1 tablet (40 mg total) by mouth every evening.   30 tablet   1   . naproxen (NAPROSYN) 500 MG tablet   Oral   Take 1 tablet (500 mg total) by mouth 2 (two) times daily with a meal.   20 tablet   0   . nitrofurantoin (MACRODANTIN) 100 MG capsule   Oral   Take 1 capsule (100 mg total) by mouth 2 (two) times daily.   14 capsule   0   . phenazopyridine (PYRIDIUM) 200 MG tablet   Oral   Take 1 tablet (200 mg total) by mouth 3 (three) times daily as needed for pain.   10 tablet   0   . sucralfate (CARAFATE) 1 G tablet   Oral   Take 1 tablet (1 g total) by mouth 4 (four) times daily.   60 tablet   1     Allergies Flagyl and Bactrim  No family history on file.  Social  History History  Substance Use Topics  . Smoking status: Never Smoker   . Smokeless tobacco: Not on file  . Alcohol Use: No    Review of Systems  Constitutional: No fever or chills. No weight changes Eyes:No blurry vision or double vision.  ENT: No sore throat. Cardiovascular: No chest pain. Respiratory: No dyspnea or cough. Gastrointestinal: Left lower quadrant abdominal pain as above.  No BRBPR or melena. Genitourinary: Negative for dysuria, urinary retention, bloody urine, or difficulty urinating. Musculoskeletal: Negative for back pain. No joint swelling or pain. Skin: Negative for rash. Neurological: Negative for headaches, focal weakness or numbness. Psychiatric:No anxiety or depression.   Endocrine:No hot/cold intolerance, changes in energy, or sleep difficulty.  10-point ROS otherwise negative.  ____________________________________________   PHYSICAL EXAM:  VITAL SIGNS: ED Triage Vitals  Enc Vitals Group     BP 10/13/14 1309 140/79 mmHg     Pulse Rate 10/13/14 1309 73     Resp 10/13/14 1309 18     Temp 10/13/14 1309 98.3 F (36.8 C)     Temp Source 10/13/14 1309 Oral     SpO2 10/13/14 1309 98 %     Weight 10/13/14 1309 205 lb (92.987 kg)     Height 10/13/14 1309  (1.626 m)  Head Cir --      Peak Flow --      Pain Score 10/13/14 1310 7     Pain Loc --      Pain Edu? --      Excl. in GC? --      Constitutional: Alert and oriented. Well appearing and in no distress. Eyes: No scleral icterus. No conjunctival pallor. PERRL. EOMI ENT   Head: Normocephalic and atraumatic.   Nose: No congestion/rhinnorhea. No septal hematoma   Mouth/Throat: MMM, no pharyngeal erythema. No peritonsillar mass. No uvula shift.   Neck: No stridor. No SubQ emphysema. No meningismus. Hematological/Lymphatic/Immunilogical: No cervical lymphadenopathy. Cardiovascular: RRR. Normal and symmetric distal pulses are present in all extremities. No murmurs, rubs, or  gallops. Respiratory: Normal respiratory effort without tachypnea nor retractions. Breath sounds are clear and equal bilaterally. No wheezes/rales/rhonchi. Gastrointestinal: Suprapubic tenderness. No distention. There is no CVA tenderness.  No rebound, rigidity, or guarding. Genitourinary: Normal external exam. Speculum exam reveals cervicitis with a scant discharge about the cervix. Bimanual exam reveals a nonenlarged uterus, positive cervical motion tenderness, no adnexal tenderness or masses. Musculoskeletal: Nontender with normal range of motion in all extremities. No joint effusions.  No lower extremity tenderness.  No edema. Neurologic:   Normal speech and language.  CN 2-10 normal. Motor grossly intact. No pronator drift.  Normal gait. No gross focal neurologic deficits are appreciated.  Skin:  Skin is warm, dry and intact. No rash noted.  No petechiae, purpura, or bullae. Psychiatric: Mood and affect are normal. Speech and behavior are normal. Patient exhibits appropriate insight and judgment.  ____________________________________________    LABS (pertinent positives/negatives) (all labs ordered are listed, but only abnormal results are displayed) Labs Reviewed  WET PREP, GENITAL - Abnormal; Notable for the following:    WBC, Wet Prep HPF POC FEW (*)    All other components within normal limits  URINALYSIS COMPLETEWITH MICROSCOPIC ([facility] ONLY) - Abnormal; Notable for the following:    Color, Urine YELLOW (*)    APPearance CLOUDY (*)    Leukocytes, UA 3+ (*)    Bacteria, UA RARE (*)    Squamous Epithelial / LPF 6-30 (*)    All other components within normal limits  CHLAMYDIA/NGC RT PCR ([facility] ONLY)  URINE CULTURE  POCT PREGNANCY, URINE   urine white blood cell count too numerous to count ____________________________________________   EKG    ____________________________________________     RADIOLOGY    ____________________________________________   PROCEDURES  ____________________________________________   INITIAL IMPRESSION / ASSESSMENT AND PLAN / ED COURSE  Pertinent labs & imaging results that were available during my care of the patient were reviewed by me and considered in my medical decision making (see chart for details).  Patient presents with 2 weeks of pelvic pain. With exam findings, I have low suspicion for torsion or ovarian cyst, low suspicion for ectopic or pregnancy. This appears to be due to cervicitis. We'll check a swab from the lab and follow-up urinalysis. ----------------------------------------- 4:11 PM on 10/13/2014 -----------------------------------------  Wet prep unremarkable. Urinalysis is convincing for urinary tract infection indicating cystitis. We'll treat the patient with Macrobid and have her follow-up. Low suspicion of TOA PID STI pregnancy or other complications. ____________________________________________   FINAL CLINICAL IMPRESSION(S) / ED DIAGNOSES  Final diagnoses:  Cystitis      Sharman Cheek, MD 10/13/14 [phone]

## 2014-10-13 NOTE — ED Notes (Signed)
Pt presents with right lower pelvic pain started about two weeks ago. Pt denies any bleeding but is having some vaginal discharged with no odor.

## 2014-10-13 NOTE — ED Notes (Signed)
Pelvic exam completed by edp and this RN.

## 2014-10-16 LAB — URINE CULTURE

## 2014-10-26 ENCOUNTER — Emergency Department
Admission: EM | Admit: 2014-10-26 | Discharge: 2014-10-26 | Disposition: A | Discharge status: Home / Self Care | Payer: Medicaid Other | Attending: Emergency Medicine | Admitting: Emergency Medicine

## 2014-10-26 ENCOUNTER — Emergency Department: Payer: Medicaid Other

## 2014-10-26 ENCOUNTER — Encounter: Payer: Self-pay | Admitting: Emergency Medicine

## 2014-10-26 DIAGNOSIS — M25561 Pain in right knee: Secondary | ICD-10-CM | POA: Diagnosis not present

## 2014-10-26 MED ORDER — OXYCODONE-ACETAMINOPHEN 5-325 MG PO TABS: 1.0000 | ORAL_TABLET | Freq: Three times a day (TID) | ORAL | Status: DC | PRN

## 2014-10-26 MED ORDER — MELOXICAM 15 MG PO TABS: 15.0000 mg | ORAL_TABLET | Freq: Every day | ORAL | Status: DC | PRN

## 2014-10-26 NOTE — ED Provider Notes (Signed)
[facility] Emergency Department Provider Note  ____________________________________________  Time seen: Approximately 11:23 AM  I have reviewed the triage vital signs and the nursing notes.   HISTORY  Chief Complaint Knee Pain   HPI Jessica Wong is a 29 y.o. female presents to the ER for complaints of right knee pain. Patient reports that she has a history of right knee pain including having meniscus repair in 2008. Patient reports that this weekend she was walking up the heel and felt that she accidentally hyperextended her knee causing increased pain. Patient reports that yesterday afternoon the knee was hurting and she states that as she was walking she felt a large pop and has had increased pain since. Denies fall or direct trauma. States pain is constant but increases with movement.   Denies other pain or injury. Denies calf pain. Denies swelling or redness. Denies break in skin. Patient states that pain is currently 7 out of 10 aching and throbbing.  Past Medical History  Diagnosis Date  . Thyroid disease     There are no active problems to display for this patient.   History reviewed. No pertinent past surgical history.  Current Outpatient Rx  Name  Route  Sig  Dispense  Refill  .           .           .           .           .             Allergies Flagyl and Bactrim  History reviewed. No pertinent family history.  Social History Social History  Substance Use Topics  . Smoking status: Never Smoker   . Smokeless tobacco: None  . Alcohol Use: No    Review of Systems Constitutional: No fever/chills Eyes: No visual changes. ENT: No sore throat. Cardiovascular: Denies chest pain. Respiratory: Denies shortness of breath. Gastrointestinal: No abdominal pain.  No nausea, no vomiting.  No diarrhea.  No constipation. Genitourinary: Negative for dysuria. Musculoskeletal: Negative for back pain. positive for right knee pain. Skin:  Negative for rash. Neurological: Negative for headaches, focal weakness or numbness.  10-point ROS otherwise negative.  ____________________________________________   PHYSICAL EXAM:  VITAL SIGNS: ED Triage Vitals  Enc Vitals Group     BP 10/26/14 1028 142/92 mmHg     Pulse Rate 10/26/14 1028 82     Resp 10/26/14 1028 20     Temp 10/26/14 1028 97.9 F (36.6 C)     Temp Source 10/26/14 1028 Oral     SpO2 10/26/14 1028 99 %     Weight 10/26/14 1028 205 lb (92.987 kg)     Height 10/26/14 1028  (1.626 m)     Head Cir --      Peak Flow --      Pain Score 10/26/14 1029 8     Pain Loc --      Pain Edu? --      Excl. in GC? --     Constitutional: Alert and oriented. Well appearing and in no acute distress. Eyes: Conjunctivae are normal. PERRL. EOMI. Head: Atraumatic.  Nose: No congestion/rhinnorhea.  Mouth/Throat: Mucous membranes are moist.   Neck: No stridor.  No cervical spine tenderness to palpation. Hematological/Lymphatic/Immunilogical: No cervical lymphadenopathy. Cardiovascular: Normal rate, regular rhythm. Grossly normal heart sounds.  Good peripheral circulation. Respiratory: Normal respiratory effort.  No retractions. Lungs CTAB. Gastrointestinal: Soft and  nontender. No distention. Normal Bowel sounds.   Musculoskeletal: No lower or upper extremity tenderness nor edema.  No joint effusions. Bilateral pedal pulses equal and easily palpated.  Except: right knee mod with anterior and posterior drawer test and medial and lateral stress test, mild to mod TTP anterior knee, no posterior knee TTP. NO calf tenderness. Pain with flexion.  Neurologic:  Normal speech and language. No gross focal neurologic deficits are appreciated. No gait instability. Skin:  Skin is warm, dry and intact. No rash noted. Psychiatric: Mood and affect are normal. Speech and behavior are normal.  ____________________________________________   LABS (all labs ordered are listed, but only  abnormal results are displayed)  Labs Reviewed - No data to display  RADIOLOGY   EXAM: RIGHT KNEE - COMPLETE 4+ VIEW  COMPARISON: None.  FINDINGS: No joint effusion. Sequelae of cruciate ligament repair is evident. Joint spaces and alignment are within normal limits. No acute osseous abnormality identified.  IMPRESSION: No joint effusion or acute osseous abnormality identified at the right knee.   Electronically Signed By: Odessa Fleming M.D. On: 10/26/2014 11:21  I, Renford Dills, personally viewed and evaluated these images (plain radiographs) as part of my medical decision making.  ____________________________________________   PROCEDURES  Procedure(s) performed:   SPLINT APPLICATION Date/Time: 11:33 AM Authorized by: Renford Dills Consent: Verbal consent obtained. Risks and benefits: risks, benefits and alternatives were discussed Consent given by: patient Splint applied by: ed technician Location details: right knee immobilizer and crutches Post-procedure: The splinted body part was neurovascularly unchanged following the procedure. Patient tolerance: Patient tolerated the procedure well with no immediate complications.    ____________________________________________   INITIAL IMPRESSION / ASSESSMENT AND PLAN / ED COURSE  Pertinent labs & imaging results that were available during my care of the patient were reviewed by me and considered in my medical decision making (see chart for details).  Well appearing patient. No acute distress. PResents for right knee pain post hearing loud pop, history of meniscus repair to same knee. Xray no joint effusion or acute osseous abnormality identified at the right knee. Concern for meniscus or ligamentous injury. Immobilizer and crutches and follow up with orthopedic. PAtient verbalized understanding and agreed to plan.  ____________________________________________   FINAL CLINICAL IMPRESSION(S) / ED  DIAGNOSES  Final diagnoses:  Right knee pain       Renford Dills, NP 10/26/14 1134  Myrna Blazer, MD 10/26/14 2242

## 2014-10-26 NOTE — Discharge Instructions (Signed)
Take medication as prescribed. Apply ice and elevate. Rest knee. Wear knee immobilizer and use crutches.   Follow up with orthopedic next week. See above to call today to schedule follow up.   Return to ER for new or worsening concerns.   Knee Pain The knee is the complex joint between your thigh and your lower leg. It is made up of bones, tendons, ligaments, and cartilage. The bones that make up the knee are:  The femur in the thigh.  The tibia and fibula in the lower leg.  The patella or kneecap riding in the groove on the lower femur. CAUSES  Knee pain is a common complaint with many causes. A few of these causes are:  Injury, such as:  A ruptured ligament or tendon injury.  Torn cartilage.  Medical conditions, such as:  Gout  Arthritis  Infections  Overuse, over training, or overdoing a physical activity. Knee pain can be minor or severe. Knee pain can accompany debilitating injury. Minor knee problems often respond well to self-care measures or get well on their own. More serious injuries may need medical intervention or even surgery. SYMPTOMS The knee is complex. Symptoms of knee problems can vary widely. Some of the problems are:  Pain with movement and weight bearing.  Swelling and tenderness.  Buckling of the knee.  Inability to straighten or extend your knee.  Your knee locks and you cannot straighten it.  Warmth and redness with pain and fever.  Deformity or dislocation of the kneecap. DIAGNOSIS  Determining what is wrong may be very straight forward such as when there is an injury. It can also be challenging because of the complexity of the knee. Tests to make a diagnosis may include:  Your caregiver taking a history and doing a physical exam.  Routine X-rays can be used to rule out other problems. X-rays will not reveal a cartilage tear. Some injuries of the knee can be diagnosed by:  Arthroscopy a surgical technique by which a small video camera  is inserted through tiny incisions on the sides of the knee. This procedure is used to examine and repair internal knee joint problems. Tiny instruments can be used during arthroscopy to repair the torn knee cartilage (meniscus).  Arthrography is a radiology technique. A contrast liquid is directly injected into the knee joint. Internal structures of the knee joint then become visible on X-ray film.  An MRI scan is a non X-ray radiology procedure in which magnetic fields and a computer produce two- or three-dimensional images of the inside of the knee. Cartilage tears are often visible using an MRI scanner. MRI scans have largely replaced arthrography in diagnosing cartilage tears of the knee.  Blood work.  Examination of the fluid that helps to lubricate the knee joint (synovial fluid). This is done by taking a sample out using a needle and a syringe. TREATMENT The treatment of knee problems depends on the cause. Some of these treatments are:  Depending on the injury, proper casting, splinting, surgery, or physical therapy care will be needed.  Give yourself adequate recovery time. Do not overuse your joints. If you begin to get sore during workout routines, back off. Slow down or do fewer repetitions.  For repetitive activities such as cycling or running, maintain your strength and nutrition.  Alternate muscle groups. For example, if you are a weight lifter, work the upper body on one day and the lower body the next.  Either tight or weak muscles do not give  the proper support for your knee. Tight or weak muscles do not absorb the stress placed on the knee joint. Keep the muscles surrounding the knee strong.  Take care of mechanical problems.  If you have flat feet, orthotics or special shoes may help. See your caregiver if you need help.  Arch supports, sometimes with wedges on the inner or outer aspect of the heel, can help. These can shift pressure away from the side of the knee most  bothered by osteoarthritis.  A brace called an "unloader" brace also may be used to help ease the pressure on the most arthritic side of the knee.  If your caregiver has prescribed crutches, braces, wraps or ice, use as directed. The acronym for this is PRICE. This means protection, rest, ice, compression, and elevation.  Nonsteroidal anti-inflammatory drugs (NSAIDs), can help relieve pain. But if taken immediately after an injury, they may actually increase swelling. Take NSAIDs with food in your stomach. Stop them if you develop stomach problems. Do not take these if you have a history of ulcers, stomach pain, or bleeding from the bowel. Do not take without your caregiver's approval if you have problems with fluid retention, heart failure, or kidney problems.  For ongoing knee problems, physical therapy may be helpful.  Glucosamine and chondroitin are over-the-counter dietary supplements. Both may help relieve the pain of osteoarthritis in the knee. These medicines are different from the usual anti-inflammatory drugs. Glucosamine may decrease the rate of cartilage destruction.  Injections of a corticosteroid drug into your knee joint may help reduce the symptoms of an arthritis flare-up. They may provide pain relief that lasts a few months. You may have to wait a few months between injections. The injections do have a small increased risk of infection, water retention, and elevated blood sugar levels.  Hyaluronic acid injected into damaged joints may ease pain and provide lubrication. These injections may work by reducing inflammation. A series of shots may give relief for as long as 6 months.  Topical painkillers. Applying certain ointments to your skin may help relieve the pain and stiffness of osteoarthritis. Ask your pharmacist for suggestions. Many over the-counter products are approved for temporary relief of arthritis pain.  In some countries, doctors often prescribe topical NSAIDs for  relief of chronic conditions such as arthritis and tendinitis. A review of treatment with NSAID creams found that they worked as well as oral medications but without the serious side effects. PREVENTION  Maintain a healthy weight. Extra pounds put more strain on your joints.  Get strong, stay limber. Weak muscles are a common cause of knee injuries. Stretching is important. Include flexibility exercises in your workouts.  Be smart about exercise. If you have osteoarthritis, chronic knee pain or recurring injuries, you may need to change the way you exercise. This does not mean you have to stop being active. If your knees ache after jogging or playing basketball, consider switching to swimming, water aerobics, or other low-impact activities, at least for a few days a week. Sometimes limiting high-impact activities will provide relief.  Make sure your shoes fit well. Choose footwear that is right for your sport.  Protect your knees. Use the proper gear for knee-sensitive activities. Use kneepads when playing volleyball or laying carpet. Buckle your seat belt every time you drive. Most shattered kneecaps occur in car accidents.  Rest when you are tired. SEEK MEDICAL CARE IF:  You have knee pain that is continual and does not seem to be getting  getting better.  °SEEK IMMEDIATE MEDICAL CARE IF:  °Your knee joint feels hot to the touch and you have a high fever. °MAKE SURE YOU:  °· Understand these instructions. °· Will watch your condition. °· Will get help right away if you are not doing well or get worse. °Document Released: 12/23/2006 Document Revised: 05/20/2011 Document Reviewed: 12/23/2006 °[employer]® Patient Information ©2015 [employer]. This information is not intended to replace advice given to you by your health care provider. Make sure you discuss any questions you have with your health care provider. ° °

## 2014-10-26 NOTE — ED Notes (Signed)
Patient to ED with c/o right knee pain, reports old injury and surgery to same knee, now reports hurting over the last couple of days.

## 2015-03-03 ENCOUNTER — Emergency Department
Admission: EM | Admit: 2015-03-03 | Discharge: 2015-03-03 | Disposition: A | Discharge status: Home / Self Care | Payer: Medicaid Other | Attending: Emergency Medicine | Admitting: Emergency Medicine

## 2015-03-03 ENCOUNTER — Emergency Department: Payer: Medicaid Other

## 2015-03-03 DIAGNOSIS — Z791 Long term (current) use of non-steroidal anti-inflammatories (NSAID): Secondary | ICD-10-CM | POA: Insufficient documentation

## 2015-03-03 DIAGNOSIS — Z792 Long term (current) use of antibiotics: Secondary | ICD-10-CM | POA: Insufficient documentation

## 2015-03-03 DIAGNOSIS — R05 Cough: Secondary | ICD-10-CM | POA: Diagnosis present

## 2015-03-03 DIAGNOSIS — J209 Acute bronchitis, unspecified: Secondary | ICD-10-CM | POA: Insufficient documentation

## 2015-03-03 DIAGNOSIS — Z79899 Other long term (current) drug therapy: Secondary | ICD-10-CM | POA: Diagnosis not present

## 2015-03-03 MED ORDER — ACETAMINOPHEN-CODEINE #3 300-30 MG PO TABS: 1.0000 | ORAL_TABLET | ORAL | Status: DC | PRN

## 2015-03-03 MED ORDER — AZITHROMYCIN 250 MG PO TABS: 500.0000 mg | ORAL_TABLET | Freq: Every day | ORAL | Status: AC

## 2015-03-03 MED ORDER — ACETAMINOPHEN-CODEINE #3 300-30 MG PO TABS
1.0000 | ORAL_TABLET | Freq: Once | ORAL | Status: AC
  Administered 2015-03-03: 1 via ORAL
  Filled 2015-03-03: qty 1

## 2015-03-03 MED ORDER — AZITHROMYCIN 250 MG PO TABS
500.0000 mg | ORAL_TABLET | Freq: Once | ORAL | Status: AC
  Administered 2015-03-03: 500 mg via ORAL
  Filled 2015-03-03: qty 2

## 2015-03-03 NOTE — ED Notes (Signed)
Brown, MD and RN at bedside. 

## 2015-03-03 NOTE — ED Notes (Signed)
Pt in with cold symptoms x 3 days with dry cough.

## 2015-03-03 NOTE — Discharge Instructions (Signed)

## 2015-03-03 NOTE — ED Provider Notes (Signed)
[facility] Emergency Department Provider Note  ____________________________________________  Time seen: 4:30 AM  I have reviewed the triage vital signs and the nursing notes.   HISTORY  Chief Complaint Cough   HPI Jessica Wong is a 29 y.o. female presents with nonproductive cough and congestion 3 days. Patient denies any fever no chills. Patient denies any tobacco use.    Past Medical History  Diagnosis Date  . Thyroid disease     There are no active problems to display for this patient.   No past surgical history on file.  Current Outpatient Rx  Name  Route  Sig  Dispense  Refill  . acetaminophen-codeine (TYLENOL #3) 300-30 MG tablet   Oral   Take 1 tablet by mouth every 4 (four) hours as needed for moderate pain.   20 tablet   0   . azithromycin (ZITHROMAX) 250 MG tablet   Oral   Take 2 tablets (500 mg total) by mouth daily.   6 each   0   . cephALEXin (KEFLEX) 500 MG capsule   Oral   Take 1 capsule (500 mg total) by mouth 3 (three) times daily.   21 capsule   0   . famotidine (PEPCID) 40 MG tablet   Oral   Take 1 tablet (40 mg total) by mouth every evening.   30 tablet   1   . meloxicam (MOBIC) 15 MG tablet   Oral   Take 1 tablet (15 mg total) by mouth daily as needed for pain.   10 tablet   0   . naproxen (NAPROSYN) 500 MG tablet   Oral   Take 1 tablet (500 mg total) by mouth 2 (two) times daily with a meal.   20 tablet   0   . oxyCODONE-acetaminophen (ROXICET) 5-325 MG per tablet   Oral   Take 1 tablet by mouth every 8 (eight) hours as needed for moderate pain or severe pain (Do not drive or operate heavy machinery while taking as can cause drowsiness.).   9 tablet   0   . phenazopyridine (PYRIDIUM) 200 MG tablet   Oral   Take 1 tablet (200 mg total) by mouth 3 (three) times daily as needed for pain.   10 tablet   0   . sucralfate (CARAFATE) 1 G tablet   Oral   Take 1 tablet (1 g total) by mouth 4  (four) times daily.   60 tablet   1     Allergies Flagyl and Bactrim  No family history on file.  Social History Social History  Substance Use Topics  . Smoking status: Never Smoker   . Smokeless tobacco: Not on file  . Alcohol Use: No    Review of Systems  Constitutional: Negative for fever. Eyes: Negative for visual changes. ENT: Negative for sore throat. Cardiovascular: Negative for chest pain. Respiratory: Negative for shortness of breath. As a for cough and congestion Gastrointestinal: Negative for abdominal pain, vomiting and diarrhea. Genitourinary: Negative for dysuria. Musculoskeletal: Negative for back pain. Skin: Negative for rash. Neurological: Negative for headaches, focal weakness or numbness.  10-point ROS otherwise negative.  ____________________________________________   PHYSICAL EXAM:  VITAL SIGNS: ED Triage Vitals  Enc Vitals Group     BP 03/03/15 0240 125/62 mmHg     Pulse Rate 03/03/15 0240 77     Resp 03/03/15 0240 18     Temp 03/03/15 0240 98.3 F (36.8 C)     Temp Source 03/03/15  0240 Oral     SpO2 03/03/15 0240 96 %     Weight 03/03/15 0240 210 lb (95.255 kg)     Height 03/03/15 0240 5\' 4"  (1.626 m)     Head Cir --      Peak Flow --      Pain Score 03/03/15 0240 5     Pain Loc --      Pain Edu? --      Excl. in GC? --      Constitutional: Alert and oriented. Well appearing and in no distress. Eyes: Conjunctivae are normal. PERRL. Normal extraocular movements. ENT   Head: Normocephalic and atraumatic.   Nose: No congestion/rhinnorhea.   Mouth/Throat: Mucous membranes are moist.   Neck: No stridor. Hematological/Lymphatic/Immunilogical: No cervical lymphadenopathy. Cardiovascular: Normal rate, regular rhythm. Normal and symmetric distal pulses are present in all extremities. No murmurs, rubs, or gallops. Respiratory: Normal respiratory effort without tachypnea nor retractions. Breath sounds are clear and equal  bilaterally. No wheezes/rales/rhonchi. Gastrointestinal: Soft and nontender. No distention. There is no CVA tenderness. Genitourinary: deferred Musculoskeletal: Nontender with normal range of motion in all extremities. No joint effusions.  No lower extremity tenderness nor edema. Neurologic:  Normal speech and language. No gross focal neurologic deficits are appreciated. Speech is normal.  Skin:  Skin is warm, dry and intact. No rash noted. Psychiatric: Mood and affect are normal. Speech and behavior are normal. Patient exhibits appropriate insight and judgment.      RADIOLOGY       INITIAL IMPRESSION / ASSESSMENT AND PLAN / ED COURSE  Pertinent labs & imaging results that were available during my care of the patient were reviewed by me and considered in my medical decision making (see chart for details).    ____________________________________________   FINAL CLINICAL IMPRESSION(S) / ED DIAGNOSES  Final diagnoses:  Acute bronchitis, unspecified organism      Darci Currentandolph N Shayna Eblen, MD 03/03/15 [phone]

## 2015-05-30 ENCOUNTER — Emergency Department
Admission: EM | Admit: 2015-05-30 | Discharge: 2015-05-30 | Disposition: A | Discharge status: Home / Self Care | Payer: Medicaid Other | Attending: Emergency Medicine | Admitting: Emergency Medicine

## 2015-05-30 ENCOUNTER — Encounter: Payer: Self-pay | Admitting: *Deleted

## 2015-05-30 DIAGNOSIS — K0889 Other specified disorders of teeth and supporting structures: Secondary | ICD-10-CM | POA: Insufficient documentation

## 2015-05-30 DIAGNOSIS — R079 Chest pain, unspecified: Secondary | ICD-10-CM | POA: Insufficient documentation

## 2015-05-30 DIAGNOSIS — F419 Anxiety disorder, unspecified: Secondary | ICD-10-CM

## 2015-05-30 DIAGNOSIS — Z79899 Other long term (current) drug therapy: Secondary | ICD-10-CM | POA: Insufficient documentation

## 2015-05-30 DIAGNOSIS — R220 Localized swelling, mass and lump, head: Secondary | ICD-10-CM

## 2015-05-30 DIAGNOSIS — Z791 Long term (current) use of non-steroidal anti-inflammatories (NSAID): Secondary | ICD-10-CM | POA: Insufficient documentation

## 2015-05-30 LAB — BASIC METABOLIC PANEL
ANION GAP: 6 (ref 5–15)
BUN: 14 mg/dL (ref 6–20)
CHLORIDE: 106 mmol/L (ref 101–111)
CO2: 25 mmol/L (ref 22–32)
Calcium: 8.5 mg/dL — ABNORMAL LOW (ref 8.9–10.3)
Creatinine, Ser: 0.79 mg/dL (ref 0.44–1.00)
Glucose, Bld: 101 mg/dL — ABNORMAL HIGH (ref 65–99)
POTASSIUM: 3.5 mmol/L (ref 3.5–5.1)
SODIUM: 137 mmol/L (ref 135–145)

## 2015-05-30 LAB — CBC
HCT: 36.8 % (ref 35.0–47.0)
HEMOGLOBIN: 12.6 g/dL (ref 12.0–16.0)
MCH: 29.9 pg (ref 26.0–34.0)
MCHC: 34.3 g/dL (ref 32.0–36.0)
MCV: 87.3 fL (ref 80.0–100.0)
PLATELETS: 303 10*3/uL (ref 150–440)
RBC: 4.22 MIL/uL (ref 3.80–5.20)
RDW: 13.4 % (ref 11.5–14.5)
WBC: 7.6 10*3/uL (ref 3.6–11.0)

## 2015-05-30 LAB — TROPONIN I

## 2015-05-30 MED ORDER — IBUPROFEN 800 MG PO TABS: 800.0000 mg | ORAL_TABLET | Freq: Three times a day (TID) | ORAL | Status: DC | PRN

## 2015-05-30 MED ORDER — HYDROMORPHONE HCL 1 MG/ML IJ SOLN
1.0000 mg | Freq: Once | INTRAMUSCULAR | Status: AC
  Administered 2015-05-30: 1 mg via INTRAMUSCULAR

## 2015-05-30 MED ORDER — KETOROLAC TROMETHAMINE 30 MG/ML IJ SOLN: 60.0000 mg | Freq: Once | INTRAMUSCULAR | Status: DC

## 2015-05-30 MED ORDER — HYDROMORPHONE HCL 1 MG/ML IJ SOLN
INTRAMUSCULAR | Status: AC
  Administered 2015-05-30: 1 mg via INTRAMUSCULAR
  Filled 2015-05-30: qty 1

## 2015-05-30 MED ORDER — KETOROLAC TROMETHAMINE 10 MG PO TABS: 10.0000 mg | ORAL_TABLET | Freq: Three times a day (TID) | ORAL | Status: DC | PRN

## 2015-05-30 MED ORDER — OXYCODONE-ACETAMINOPHEN 5-325 MG PO TABS: 1.0000 | ORAL_TABLET | Freq: Four times a day (QID) | ORAL | Status: DC | PRN

## 2015-05-30 MED ORDER — KETOROLAC TROMETHAMINE 60 MG/2ML IM SOLN
INTRAMUSCULAR | Status: AC
  Administered 2015-05-30: 60 mg
  Filled 2015-05-30: qty 2

## 2015-05-30 NOTE — ED Provider Notes (Signed)
[facility] Emergency Department Provider Note  ____________________________________________  Time seen: Approximately 9:16 PM  I have reviewed the triage vital signs and the nursing notes.   HISTORY  Chief Complaint Dental Pain and Chest Pain    HPI Jessica Wong is a 30 y.o. female w/ a hx of anxiety and poor dentition, s/p extraction tooth #20 yesterday, presenting for dental pain.  Patient reports that she is currently taking penicillin. Yesterday and earlier today, the patient was taking 600-800 mg of Motrin and occasional Vicodin with good pain control. This afternoon her pain became significantly worse. She was unable to reach her dentist. She denies any fever, severe headache, stiff neck, or other systemic symptoms including nausea and vomiting. Her pain has caused her to have panic attacks, which are typical for her, and caused some chest tightness. There is nothing unusual about her panic attacks; they are similar in character and severity to previous panic attacks.   Past Medical History  Diagnosis Date  . Thyroid disease     There are no active problems to display for this patient.   No past surgical history on file.  Current Outpatient Rx  Name  Route  Sig  Dispense  Refill  . cephALEXin (KEFLEX) 500 MG capsule   Oral   Take 1 capsule (500 mg total) by mouth 3 (three) times daily.   21 capsule   0   . famotidine (PEPCID) 40 MG tablet   Oral   Take 1 tablet (40 mg total) by mouth every evening.   30 tablet   1   . ibuprofen (ADVIL,MOTRIN) 800 MG tablet   Oral   Take 1 tablet (800 mg total) by mouth every 8 (eight) hours as needed.   20 tablet   0   . meloxicam (MOBIC) 15 MG tablet   Oral   Take 1 tablet (15 mg total) by mouth daily as needed for pain.   10 tablet   0   . oxyCODONE-acetaminophen (ROXICET) 5-325 MG tablet   Oral   Take 1 tablet by mouth every 6 (six) hours as needed for moderate pain (with food).   20  tablet   0   . phenazopyridine (PYRIDIUM) 200 MG tablet   Oral   Take 1 tablet (200 mg total) by mouth 3 (three) times daily as needed for pain.   10 tablet   0   . sucralfate (CARAFATE) 1 G tablet   Oral   Take 1 tablet (1 g total) by mouth 4 (four) times daily.   60 tablet   1     Allergies Flagyl and Bactrim  No family history on file.  Social History Social History  Substance Use Topics  . Smoking status: Never Smoker   . Smokeless tobacco: None  . Alcohol Use: No    Review of Systems Constitutional: No fever/chills. No lightheadedness or syncope. Eyes: No visual changes. ENT: No sore throat. Positive dental pain. Positive mild facial swelling. Cardiovascular: Denies chest pain, palpitations. Respiratory: Denies shortness of breath.  No cough. Gastrointestinal: No abdominal pain.  No nausea, no vomiting.  No diarrhea.  No constipation. Genitourinary: Negative for dysuria. Musculoskeletal: Negative for back pain. Skin: Negative for rash. Neurological: Negative for headaches, focal weakness or numbness. Psychiatric:Positive anxiety 10-point ROS otherwise negative.  ____________________________________________   PHYSICAL EXAM:  VITAL SIGNS: ED Triage Vitals  Enc Vitals Group     BP --      Pulse --  Resp --      Temp --      Temp src --      SpO2 --      Weight --      Height --      Head Cir --      Peak Flow --      Pain Score 05/30/15 1926 10     Pain Loc --      Pain Edu? --      Excl. in GC? --     Constitutional: Patient is alert and oriented and able to answer questions properly. She is mildly uncomfortable but nontoxic.  Eyes: Conjunctivae are normal.  EOMI. Head: Mild swelling over the left mandible. Nose: No congestion/rhinnorhea. Mouth/Throat: Mucous membranes are moist. Patient has a cavity at tooth 20 that is typical for extraction and has no associated during linens, significant swelling purchase mild trismus but is able to  open her mouth. She is maintaining her secretions well and has no stridor. No halitosis. The steer palate is symmetric and uvula is midline. Neck: No stridor.  Supple.  No meningismus. Cardiovascular: Normal rate, regular rhythm. No murmurs, rubs or gallops.  Respiratory: Normal respiratory effort.  No retractions. Lungs CTAB.  No wheezes, rales or ronchi. Gastrointestinal: Soft and nontender. No distention. No peritoneal signs. Musculoskeletal: As all extremity's well. Neurologic:  Normal speech and language. No gross focal neurologic deficits are appreciated.  Skin:  Skin is warm, dry and intact. No rash noted. Psychiatric: Mood is normal with mildly anxious affect. Speech and behavior are normal.  Normal judgement.  ____________________________________________   LABS (all labs ordered are listed, but only abnormal results are displayed)  Labs Reviewed  BASIC METABOLIC PANEL - Abnormal; Notable for the following:    Glucose, Bld 101 (*)    Calcium 8.5 (*)    All other components within normal limits  CBC  TROPONIN I   ____________________________________________  EKG  Not indicated ____________________________________________  RADIOLOGY  No results found.  ____________________________________________   PROCEDURES  Procedure(s) performed: None  Critical Care performed: No ____________________________________________   INITIAL IMPRESSION / ASSESSMENT AND PLAN / ED COURSE  Pertinent labs & imaging results that were available during my care of the patient were reviewed by me and considered in my medical decision making (see chart for details).  30 y.o. female with dental extraction yesterday presenting with uncontrolled dental pain. On my exam, the patient is not febrile, and has no evidence of acute infection. I do not see evidence of abscess, nor any evidence of spreading soft tissue infection. She is not meningitic. I will treat her for her pain and have her  follow-up with her dentist tomorrow. In terms of the patient's chest pain, the most likely etiology is her anxiety. From triage and EKG was obtained which does not show any changes and the patient has negative troponin. Plan discharge with close dental follow-up. Return precautions were discussed.  ____________________________________________  FINAL CLINICAL IMPRESSION(S) / ED DIAGNOSES  Final diagnoses:  Left facial swelling  Pain, dental  Anxiety  Chest pain, unspecified chest pain type      NEW MEDICATIONS STARTED DURING THIS VISIT:  New Prescriptions   IBUPROFEN (ADVIL,MOTRIN) 800 MG TABLET    Take 1 tablet (800 mg total) by mouth every 8 (eight) hours as needed.   OXYCODONE-ACETAMINOPHEN (ROXICET) 5-325 MG TABLET    Take 1 tablet by mouth every 6 (six) hours as needed for moderate pain (with food).  Rockne Menghini, MD 05/30/15 2120

## 2015-05-30 NOTE — ED Notes (Addendum)
Pt reports right lower tooth was pulled yesterday.  Now pt continues to have pain after taking advil, vicodin and pcn.  Pt states pain goes into my neck and chest.  No sob.  Nonsmoker.  No n/v/d.  No diaphoresis.  Pt tearful.   Alert.  Speech clear.

## 2015-05-30 NOTE — ED Notes (Signed)
Pt will be d/c once med hold is complete

## 2015-05-30 NOTE — Discharge Instructions (Signed)
Please continue to take your antibiotics as prescribed. Take Motrin for mild to moderate pain and Percocet for severe pain.  Do not drive within 8 hours of taking Percocet.  Call your dentist for a follow up appointment tomorrow.    Return to the emergency department if you develop severe pain, increased facial swelling, fever, vomiting or any other symptoms concerning to you.

## 2015-06-23 ENCOUNTER — Emergency Department
Admission: EM | Admit: 2015-06-23 | Discharge: 2015-06-23 | Disposition: A | Discharge status: Home / Self Care | Payer: Medicaid Other | Attending: Student | Admitting: Student

## 2015-06-23 ENCOUNTER — Encounter: Payer: Self-pay | Admitting: Emergency Medicine

## 2015-06-23 DIAGNOSIS — Y939 Activity, unspecified: Secondary | ICD-10-CM | POA: Insufficient documentation

## 2015-06-23 DIAGNOSIS — W240XXA Contact with lifting devices, not elsewhere classified, initial encounter: Secondary | ICD-10-CM | POA: Insufficient documentation

## 2015-06-23 DIAGNOSIS — S39012A Strain of muscle, fascia and tendon of lower back, initial encounter: Secondary | ICD-10-CM

## 2015-06-23 DIAGNOSIS — E079 Disorder of thyroid, unspecified: Secondary | ICD-10-CM | POA: Insufficient documentation

## 2015-06-23 DIAGNOSIS — J45909 Unspecified asthma, uncomplicated: Secondary | ICD-10-CM | POA: Insufficient documentation

## 2015-06-23 DIAGNOSIS — Y929 Unspecified place or not applicable: Secondary | ICD-10-CM | POA: Insufficient documentation

## 2015-06-23 DIAGNOSIS — Y999 Unspecified external cause status: Secondary | ICD-10-CM | POA: Insufficient documentation

## 2015-06-23 HISTORY — DX: Unspecified asthma, uncomplicated: J45.909

## 2015-06-23 MED ORDER — HYDROCODONE-ACETAMINOPHEN 5-325 MG PO TABS
1.0000 | ORAL_TABLET | Freq: Once | ORAL | Status: AC
  Administered 2015-06-23: 1 via ORAL
  Filled 2015-06-23: qty 1

## 2015-06-23 MED ORDER — DIAZEPAM 2 MG PO TABS: 2.0000 mg | ORAL_TABLET | Freq: Three times a day (TID) | ORAL | Status: DC | PRN

## 2015-06-23 MED ORDER — NAPROXEN 500 MG PO TABS: 500.0000 mg | ORAL_TABLET | Freq: Two times a day (BID) | ORAL | Status: DC

## 2015-06-23 MED ORDER — HYDROCODONE-ACETAMINOPHEN 5-325 MG PO TABS: 1.0000 | ORAL_TABLET | ORAL | Status: DC | PRN

## 2015-06-23 MED ORDER — DIAZEPAM 5 MG PO TABS
5.0000 mg | ORAL_TABLET | Freq: Once | ORAL | Status: AC
  Administered 2015-06-23: 5 mg via ORAL
  Filled 2015-06-23: qty 1

## 2015-06-23 MED ORDER — KETOROLAC TROMETHAMINE 60 MG/2ML IM SOLN
60.0000 mg | Freq: Once | INTRAMUSCULAR | Status: AC
  Administered 2015-06-23: 60 mg via INTRAMUSCULAR
  Filled 2015-06-23: qty 2

## 2015-06-23 NOTE — ED Notes (Signed)
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  States "I felt something pop in my lower back".  Injury occurred this morning.  Ibuprofen and meloxicam

## 2015-06-23 NOTE — ED Notes (Signed)
See triage note  States she was lifting someone and felt a pop to lower back. Pain is non radiating and having increased pain with ambulation

## 2015-06-23 NOTE — ED Provider Notes (Signed)
[facility] Emergency Department Provider Note  ____________________________________________  Time seen: Approximately 4:26 PM  I have reviewed the triage vital signs and the nursing notes.   HISTORY  Chief Complaint Back Pain   HPI Jessica Wong is a 30 y.o. female 0 complaint of low back pain. 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 Patient states she is continued to have low back pain which is nonradiating since that time. She is having increased pain with ambulation and standing. She denies any previous injury to her back. She denies any urinary symptoms, no incontinence of bowel or bladder. At this time she does not want to claim Workmen's Comp. as there is a lot of paperwork involved. Currently she rates her pain as a 6/10.   Past Medical History  Diagnosis Date  . Thyroid disease   . Asthma     There are no active problems to display for this patient.   Past Surgical History  Procedure Laterality Date  . Knee surgery Right   . Dilation and curettage of uterus    . Cholecystectomy      Current Outpatient Rx  Name  Route  Sig  Dispense  Refill  . cephALEXin (KEFLEX) 500 MG capsule   Oral   Take 1 capsule (500 mg total) by mouth 3 (three) times daily.   21 capsule   0   . diazepam (VALIUM) 2 MG tablet   Oral   Take 1 tablet (2 mg total) by mouth every 8 (eight) hours as needed for muscle spasms.   9 tablet   0   . famotidine (PEPCID) 40 MG tablet   Oral   Take 1 tablet (40 mg total) by mouth every evening.   30 tablet   1   . HYDROcodone-acetaminophen (NORCO/VICODIN) 5-325 MG tablet   Oral   Take 1 tablet by mouth every 4 (four) hours as needed for moderate pain.   20 tablet   0   . ibuprofen (ADVIL,MOTRIN) 800 MG tablet   Oral   Take 1 tablet (800 mg total) by mouth every 8 (eight) hours as needed.   20 tablet   0   . meloxicam (MOBIC) 15 MG tablet   Oral    Take 1 tablet (15 mg total) by mouth daily as needed for pain.   10 tablet   0   . naproxen (NAPROSYN) 500 MG tablet   Oral   Take 1 tablet (500 mg total) by mouth 2 (two) times daily with a meal.   30 tablet   0   . oxyCODONE-acetaminophen (ROXICET) 5-325 MG tablet   Oral   Take 1 tablet by mouth every 6 (six) hours as needed for moderate pain (with food).   20 tablet   0   . phenazopyridine (PYRIDIUM) 200 MG tablet   Oral   Take 1 tablet (200 mg total) by mouth 3 (three) times daily as needed for pain.   10 tablet   0   . sucralfate (CARAFATE) 1 G tablet   Oral   Take 1 tablet (1 g total) by mouth 4 (four) times daily.   60 tablet   1     Allergies Flagyl and Bactrim  No family history on file.  Social History Social History  Substance Use Topics  . Smoking status: Never Smoker   . Smokeless tobacco: Not on file  . Alcohol Use: No  Review of Systems Constitutional: No fever/chills Cardiovascular: Denies chest pain. Respiratory: Denies shortness of breath. Gastrointestinal: No abdominal pain.  No nausea, no vomiting.   Genitourinary: Negative for dysuria. Musculoskeletal: Positive for low back pain. Neurological: Negative for  focal weakness or numbness.  10-point ROS otherwise negative.  ____________________________________________   PHYSICAL EXAM:  VITAL SIGNS: ED Triage Vitals  Enc Vitals Group     BP 06/23/15 1617 135/87 mmHg     Pulse Rate 06/23/15 1617 82     Resp 06/23/15 1617 16     Temp 06/23/15 1617 97.9 F (36.6 C)     Temp Source 06/23/15 1617 Oral     SpO2 06/23/15 1617 99 %     Weight 06/23/15 1617 215 lb (97.523 kg)     Height 06/23/15 1617  (1.626 m)     Head Cir --      Peak Flow --      Pain Score 06/23/15 1618 6     Pain Loc --      Pain Edu? --      Excl. in GC? --     Constitutional: Alert and oriented. Well appearing and in no acute distress. Eyes: Conjunctivae are normal. PERRL. EOMI. Head:  Atraumatic. Nose: No congestion/rhinnorhea. Neck: No stridor.   Cardiovascular: Normal rate, regular rhythm. Grossly normal heart sounds.  Good peripheral circulation. Respiratory: Normal respiratory effort.  No retractions. Lungs CTAB. Musculoskeletal: Examination of the eye there is no gross deformity. There is marked guarding with range of motion and patient appears to be in moderate amount of pain with movement. Lumbar spine was palpated with increased pain L5-S1 paravertebral muscles. Range of motion is restricted secondary to pain. Straight leg raises were approximate 20 and painful per patient. Neurologic:  Normal speech and language. No gross focal neurologic deficits are appreciated. No gait instability. Reflexes 2+ bilaterally. Skin:  Skin is warm, dry and intact. No rash noted. Psychiatric: Mood and affect are normal. Speech and behavior are normal.  ____________________________________________   LABS (all labs ordered are listed, but only abnormal results are displayed)  Labs Reviewed - No data to display  RADIOLOGY  Deferred. ____________________________________________   PROCEDURES  Procedure(s) performed: None  Critical Care performed: No  ____________________________________________   INITIAL IMPRESSION / ASSESSMENT AND PLAN / ED COURSE  Pertinent labs & imaging results that were available during my care of the patient were reviewed by me and considered in my medical decision making (see chart for details).  Patient was given Toradol 60 mg IM in the emergency room along with Valium 5 mg and one Norco. Patient had moderate amount of relief after approximately 35 minutes. Patient is continue on naproxen 500 mg twice a day with food, Norco as needed for severe pain, and Valium 2 mg 1 every 8 hours for muscle spasms for the next 3 days. She is encouraged to use ice or heat to her back as needed for comfort. Patient reports that she does not work this  weekend. ____________________________________________   FINAL CLINICAL IMPRESSION(S) / ED DIAGNOSES  Final diagnoses:  Lumbosacral strain, initial encounter      Tommi Rumps, PA-C 06/23/15 1732  Gayla Doss, MD 06/23/15 2022

## 2015-06-23 NOTE — Discharge Instructions (Signed)
Cryotherapy Cryotherapy is when you put ice on your injury. Ice helps lessen pain and puffiness (swelling) after an injury. Ice works the best when you start using it in the first 24 to 48 hours after an injury. HOME CARE  Put a dry or damp towel between the ice pack and your skin.  You may press gently on the ice pack.  Leave the ice on for no more than 10 to 20 minutes at a time.  Check your skin after 5 minutes to make sure your skin is okay.  Rest at least 20 minutes between ice pack uses.  Stop using ice when your skin loses feeling (numbness).  Do not use ice on someone who cannot tell you when it hurts. This includes small children and people with memory problems (dementia). GET HELP RIGHT AWAY IF:  You have white spots on your skin.  Your skin turns blue or pale.  Your skin feels waxy or hard.  Your puffiness gets worse. MAKE SURE YOU:   Understand these instructions.  Will watch your condition.  Will get help right away if you are not doing well or get worse.   This information is not intended to replace advice given to you by your health care provider. Make sure you discuss any questions you have with your health care provider.   Document Released: 08/14/2007 Document Revised: 05/20/2011 Document Reviewed: 10/18/2010 Elsevier Interactive Patient Education 2016 [employer]   Moist heat or ice to lower back as needed for comfort. [facility] your doctor or [facility] if any continued problems. Dr. Ernest PineHooten also is listed on your papers if you're not improving. Diazepam 2 mg every 8 hours as needed for muscle spasms. Norco as needed for pain. Be aware that he cannot drive or operate machinery while taking these 2 medications. Naproxen 500 mg twice a day with food.

## 2015-08-02 ENCOUNTER — Encounter: Payer: Self-pay | Admitting: Emergency Medicine

## 2015-08-02 ENCOUNTER — Emergency Department
Admission: EM | Admit: 2015-08-02 | Discharge: 2015-08-02 | Disposition: A | Discharge status: Home / Self Care | Payer: Self-pay | Attending: Emergency Medicine | Admitting: Emergency Medicine

## 2015-08-02 DIAGNOSIS — E079 Disorder of thyroid, unspecified: Secondary | ICD-10-CM | POA: Insufficient documentation

## 2015-08-02 DIAGNOSIS — N3090 Cystitis, unspecified without hematuria: Secondary | ICD-10-CM | POA: Insufficient documentation

## 2015-08-02 DIAGNOSIS — J45909 Unspecified asthma, uncomplicated: Secondary | ICD-10-CM | POA: Insufficient documentation

## 2015-08-02 LAB — URINALYSIS COMPLETE WITH MICROSCOPIC (ARMC ONLY)
BILIRUBIN URINE: NEGATIVE
GLUCOSE, UA: NEGATIVE mg/dL
KETONES UR: NEGATIVE mg/dL
Nitrite: NEGATIVE
Protein, ur: NEGATIVE mg/dL
RBC / HPF: NONE SEEN RBC/hpf (ref 0–5)
Specific Gravity, Urine: 1.01 (ref 1.005–1.030)
pH: 5 (ref 5.0–8.0)

## 2015-08-02 LAB — CBC
HCT: 35.7 % (ref 35.0–47.0)
Hemoglobin: 12.1 g/dL (ref 12.0–16.0)
MCH: 29.7 pg (ref 26.0–34.0)
MCHC: 33.8 g/dL (ref 32.0–36.0)
MCV: 87.7 fL (ref 80.0–100.0)
PLATELETS: 343 10*3/uL (ref 150–440)
RBC: 4.07 MIL/uL (ref 3.80–5.20)
RDW: 13.3 % (ref 11.5–14.5)
WBC: 10.7 10*3/uL (ref 3.6–11.0)

## 2015-08-02 LAB — COMPREHENSIVE METABOLIC PANEL
ALT: 17 U/L (ref 14–54)
AST: 17 U/L (ref 15–41)
Albumin: 4.4 g/dL (ref 3.5–5.0)
Alkaline Phosphatase: 74 U/L (ref 38–126)
Anion gap: 8 (ref 5–15)
BILIRUBIN TOTAL: 0.2 mg/dL — AB (ref 0.3–1.2)
BUN: 15 mg/dL (ref 6–20)
CHLORIDE: 108 mmol/L (ref 101–111)
CO2: 25 mmol/L (ref 22–32)
CREATININE: 0.84 mg/dL (ref 0.44–1.00)
Calcium: 9.3 mg/dL (ref 8.9–10.3)
GFR calc Af Amer: >60 mL/min (ref >60)
Glucose, Bld: 86 mg/dL (ref 65–99)
Potassium: 3.5 mmol/L (ref 3.5–5.1)
Sodium: 141 mmol/L (ref 135–145)
Total Protein: 7.4 g/dL (ref 6.5–8.1)

## 2015-08-02 LAB — LIPASE, BLOOD: LIPASE: 27 U/L (ref 11–51)

## 2015-08-02 LAB — POCT PREGNANCY, URINE: Preg Test, Ur: NEGATIVE

## 2015-08-02 MED ORDER — CEFTRIAXONE SODIUM 1 G IJ SOLR
500.0000 mg | Freq: Once | INTRAMUSCULAR | Status: AC
  Administered 2015-08-02: 500 mg via INTRAMUSCULAR
  Filled 2015-08-02: qty 10

## 2015-08-02 MED ORDER — PHENAZOPYRIDINE HCL 100 MG PO TABS: 100.0000 mg | ORAL_TABLET | Freq: Three times a day (TID) | ORAL | Status: DC | PRN

## 2015-08-02 MED ORDER — KETOROLAC TROMETHAMINE 30 MG/ML IJ SOLN
30.0000 mg | Freq: Once | INTRAMUSCULAR | Status: AC
  Administered 2015-08-02: 30 mg via INTRAMUSCULAR
  Filled 2015-08-02: qty 1

## 2015-08-02 MED ORDER — CIPROFLOXACIN HCL 500 MG PO TABS: 500.0000 mg | ORAL_TABLET | Freq: Two times a day (BID) | ORAL | Status: DC

## 2015-08-02 NOTE — ED Provider Notes (Signed)
[facility] Emergency Department Provider Note  ____________________________________________  Time seen: Approximately 9:18 PM  I have reviewed the triage vital signs and the nursing notes.   HISTORY  Chief Complaint Abdominal Pain    HPI Jessica Wong is a 30 y.o. female , NAD, presents to the emergency department accompanied by her husband at bedside. States she has had lower abdominal pain and pressure over the last few days. States she can have some bilateral lower back pain. Denies any periumbilical or upper abdominal pain. Has not had any nausea, vomiting, diarrhea. Denies dysuria, hematuria, vaginal discharge, increased urinary frequency nor any urgency or hesitancy. Denies any falls or traumas to the abdomen. Has not had any fevers, chills, body aches. Denies chest pain, shortness of breath. Has not noted any rashes or skin sores.   Past Medical History  Diagnosis Date  . Thyroid disease   . Asthma     There are no active problems to display for this patient.   Past Surgical History  Procedure Laterality Date  . Knee surgery Right   . Dilation and curettage of uterus    . Cholecystectomy      Current Outpatient Rx  Name  Route  Sig  Dispense  Refill  . ciprofloxacin (CIPRO) 500 MG tablet   Oral   Take 1 tablet (500 mg total) by mouth 2 (two) times daily.   14 tablet   0   . phenazopyridine (PYRIDIUM) 100 MG tablet   Oral   Take 1 tablet (100 mg total) by mouth 3 (three) times daily as needed for pain (May take 1-2 as needed three times daily).   9 tablet   0     Allergies Flagyl and Bactrim  No family history on file.  Social History Social History  Substance Use Topics  . Smoking status: Never Smoker   . Smokeless tobacco: None  . Alcohol Use: No     Review of Systems  Constitutional: No fever/chills Eyes: No visual changes.  Cardiovascular: No chest pain. Respiratory: No shortness of breath. No wheezing.   Gastrointestinal: Positive lower abdominal pain and pressure.  No nausea, vomiting.  No diarrhea.  No constipation. Genitourinary: Negative for dysuria, hematuria, vaginal discharge. No urinary hesitancy, urgency or increased frequency. Musculoskeletal: Positive for back pain.  Skin: Negative for rash or skin sores. Neurological: Negative for headaches, focal weakness or numbness. No tingling 10-point ROS otherwise negative.  ____________________________________________   PHYSICAL EXAM:  VITAL SIGNS: ED Triage Vitals  Enc Vitals Group     BP 08/02/15 1950 121/86 mmHg     Pulse Rate 08/02/15 1950 78     Resp 08/02/15 1950 18     Temp 08/02/15 1950 98.3 F (36.8 C)     Temp Source 08/02/15 1950 Oral     SpO2 08/02/15 1950 100 %     Weight 08/02/15 1950 210 lb (95.255 kg)     Height 08/02/15 1950 5\' 4"  (1.626 m)     Head Cir --      Peak Flow --      Pain Score 08/02/15 1951 8     Pain Loc --      Pain Edu? --      Excl. in GC? --      Constitutional: Alert and oriented. Well appearing and in no acute distress but in pain. Eyes: Conjunctivae are normal. Head: Atraumatic. Neck: Supple with full range of motion Hematological/Lymphatic/Immunilogical: No cervical lymphadenopathy. Cardiovascular: Normal rate, regular  rhythm. Normal S1 and S2.  Good peripheral circulation. Respiratory: Normal respiratory effort without tachypnea or retractions. Lungs CTAB with breath sounds noted in all lung fields. Gastrointestinal: Tenderness to palpation about the suprapubic region but the area is soft without distention or guarding. All other quadrants are soft and nontender without distention or guarding.  Musculoskeletal: No lower extremity tenderness nor edema.   Neurologic:  Normal speech and language. No gross focal neurologic deficits are appreciated.  Skin:  Skin is warm, dry and intact. No rash, skin sores noted. Psychiatric: Mood and affect are normal. Speech and behavior are normal.  Patient exhibits appropriate insight and judgement.   ____________________________________________   LABS (all labs ordered are listed, but only abnormal results are displayed)  Labs Reviewed  COMPREHENSIVE METABOLIC PANEL - Abnormal; Notable for the following:    Total Bilirubin 0.2 (*)    All other components within normal limits  URINALYSIS COMPLETEWITH MICROSCOPIC ([facility] ONLY) - Abnormal; Notable for the following:    Color, Urine STRAW (*)    APPearance CLEAR (*)    Hgb urine dipstick 1+ (*)    Leukocytes, UA TRACE (*)    Bacteria, UA RARE (*)    Squamous Epithelial / LPF 0-5 (*)    All other components within normal limits  URINE CULTURE  LIPASE, BLOOD  CBC  POC URINE PREG, ED  POCT PREGNANCY, URINE   ____________________________________________  EKG  None ____________________________________________  RADIOLOGY  None ____________________________________________    PROCEDURES  Procedure(s) performed: None    Medications  cefTRIAXone (ROCEPHIN) injection 500 mg (500 mg Intramuscular Given 08/02/15 2145)  ketorolac (TORADOL) 30 MG/ML injection 30 mg (30 mg Intramuscular Given 08/02/15 2144)     ____________________________________________   INITIAL IMPRESSION / ASSESSMENT AND PLAN / ED COURSE  Pertinent lab results that were available during my care of the patient were reviewed by me and considered in my medical decision making (see chart for details).  Patient's diagnosis is consistent with acute cystitis. Patient will be discharged home with prescriptions for Cipro and Pyridium to take as directed. Patient was given IM Rocephin and Toradol prior to discharge. Patient is to follow up with [facility] clinic if symptoms persist past this treatment course. Patient is given ED precautions to return to the ED for any worsening or new symptoms.    ____________________________________________  FINAL CLINICAL IMPRESSION(S) / ED DIAGNOSES  Final  diagnoses:  Cystitis      NEW MEDICATIONS STARTED DURING THIS VISIT:  New Prescriptions   CIPROFLOXACIN (CIPRO) 500 MG TABLET    Take 1 tablet (500 mg total) by mouth 2 (two) times daily.   PHENAZOPYRIDINE (PYRIDIUM) 100 MG TABLET    Take 1 tablet (100 mg total) by mouth 3 (three) times daily as needed for pain (May take 1-2 as needed three times daily).         Hope Pigeon, PA-C 08/02/15 2208  Minna Antis, MD 08/02/15 2306

## 2015-08-02 NOTE — ED Notes (Signed)
Patient ambulatory to triage with steady gait, without difficulty or distress noted; pt reports lower abd pain since LMP 5/19; st pain radiates into back; denies any accomp symptoms

## 2015-08-02 NOTE — Discharge Instructions (Signed)

## 2015-08-02 NOTE — ED Notes (Signed)
Discussed discharge instructions, prescriptions, and follow-up care with patient. No questions or concerns at this time. Pt stable at discharge.  

## 2015-08-04 LAB — URINE CULTURE: SPECIAL REQUESTS: NORMAL

## 2015-08-24 ENCOUNTER — Encounter: Payer: Self-pay | Admitting: *Deleted

## 2015-08-24 ENCOUNTER — Emergency Department: Payer: Self-pay

## 2015-08-24 ENCOUNTER — Emergency Department
Admission: EM | Admit: 2015-08-24 | Discharge: 2015-08-24 | Disposition: A | Discharge status: Home / Self Care | Payer: Self-pay | Attending: Emergency Medicine | Admitting: Emergency Medicine

## 2015-08-24 DIAGNOSIS — Y9339 Activity, other involving climbing, rappelling and jumping off: Secondary | ICD-10-CM | POA: Insufficient documentation

## 2015-08-24 DIAGNOSIS — S86911A Strain of unspecified muscle(s) and tendon(s) at lower leg level, right leg, initial encounter: Secondary | ICD-10-CM | POA: Insufficient documentation

## 2015-08-24 DIAGNOSIS — W16512A Jumping or diving into swimming pool striking water surface causing other injury, initial encounter: Secondary | ICD-10-CM | POA: Insufficient documentation

## 2015-08-24 DIAGNOSIS — Y999 Unspecified external cause status: Secondary | ICD-10-CM | POA: Insufficient documentation

## 2015-08-24 DIAGNOSIS — Y9234 Swimming pool (public) as the place of occurrence of the external cause: Secondary | ICD-10-CM | POA: Insufficient documentation

## 2015-08-24 DIAGNOSIS — J45909 Unspecified asthma, uncomplicated: Secondary | ICD-10-CM | POA: Insufficient documentation

## 2015-08-24 MED ORDER — TRAMADOL HCL 50 MG PO TABS: 50.0000 mg | ORAL_TABLET | Freq: Four times a day (QID) | ORAL | Status: DC | PRN

## 2015-08-24 MED ORDER — KETOROLAC TROMETHAMINE 30 MG/ML IJ SOLN
30.0000 mg | Freq: Once | INTRAMUSCULAR | Status: AC
  Administered 2015-08-24: 30 mg via INTRAMUSCULAR
  Filled 2015-08-24: qty 1
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
## 2015-08-24 NOTE — ED Notes (Signed)
crutches not given. Pt has her own at home

## 2015-08-24 NOTE — Discharge Instructions (Signed)
Elastic Bandage and RICE °WHAT DOES AN ELASTIC BANDAGE DO? °Elastic bandages come in different shapes and sizes. They generally provide support to your injury and reduce swelling while you are healing, but they can perform different functions. Your health care provider will help you to decide what is best for your protection, recovery, or rehabilitation following an injury. °WHAT ARE SOME GENERAL TIPS FOR USING AN ELASTIC BANDAGE? °· Use the bandage as directed by the maker of the bandage that you are using. °· Do not wrap the bandage too tightly. This may cut off the circulation in the arm or leg in the area below the bandage. °¨ If part of your body beyond the bandage becomes blue, numb, cold, swollen, or is more painful, your bandage is most likely too tight. If this occurs, remove your bandage and reapply it more loosely. °· See your health care provider if the bandage seems to be making your problems worse rather than better. °· An elastic bandage should be removed and reapplied every 3-4 hours or as directed by your health care provider. °WHAT IS RICE? °The routine care of many injuries includes rest, ice, compression, and elevation (RICE therapy).  °Rest °Rest is required to allow your body to heal. Generally, you can resume your routine activities when you are comfortable and have been given permission by your health care provider. °Ice °Icing your injury helps to keep the swelling down and it reduces pain. Do not apply ice directly to your skin. °· Put ice in a plastic bag. °· Place a towel between your skin and the bag. °· Leave the ice on for 20 minutes, 2-3 times per day. °Do this for as long as you are directed by your health care provider. °Compression °Compression helps to keep swelling down, gives support, and helps with discomfort. Compression may be done with an elastic bandage. °Elevation °Elevation helps to reduce swelling and it decreases pain. If possible, your injured area should be placed at  or above the level of your heart or the center of your chest. °WHEN SHOULD I SEEK MEDICAL CARE? °You should seek medical care if: °· You have persistent pain and swelling. °· Your symptoms are getting worse rather than improving. °These symptoms may indicate that further evaluation or further X-rays are needed. Sometimes, X-rays may not show a small broken bone (fracture) until a number of days later. Make a follow-up appointment with your health care provider. Ask when your X-ray results will be ready. Make sure that you get your X-ray results. °WHEN SHOULD I SEEK IMMEDIATE MEDICAL CARE? °You should seek immediate medical care if: °· You have a sudden onset of severe pain at or below the area of your injury. °· You develop redness or increased swelling around your injury. °· You have tingling or numbness at or below the area of your injury that does not improve after you remove the elastic bandage. °  °This information is not intended to replace advice given to you by your health care provider. Make sure you discuss any questions you have with your health care provider. °  °Document Released: 08/17/2001 Document Revised: 11/16/2014 Document Reviewed: 10/11/2013 °Elsevier Interactive Patient Education ©2016 [employer] ° °
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
## 2015-09-24 ENCOUNTER — Encounter: Payer: Self-pay | Admitting: Emergency Medicine

## 2015-09-24 ENCOUNTER — Emergency Department
Admission: EM | Admit: 2015-09-24 | Discharge: 2015-09-24 | Disposition: A | Discharge status: Home / Self Care | Payer: Self-pay | Attending: Emergency Medicine | Admitting: Emergency Medicine

## 2015-09-24 DIAGNOSIS — Z791 Long term (current) use of non-steroidal anti-inflammatories (NSAID): Secondary | ICD-10-CM | POA: Insufficient documentation

## 2015-09-24 DIAGNOSIS — R112 Nausea with vomiting, unspecified: Secondary | ICD-10-CM | POA: Insufficient documentation

## 2015-09-24 DIAGNOSIS — Z79899 Other long term (current) drug therapy: Secondary | ICD-10-CM | POA: Insufficient documentation

## 2015-09-24 DIAGNOSIS — J45909 Unspecified asthma, uncomplicated: Secondary | ICD-10-CM | POA: Insufficient documentation

## 2015-09-24 LAB — CBC
HEMATOCRIT: 36 % (ref 35.0–47.0)
HEMOGLOBIN: 12.7 g/dL (ref 12.0–16.0)
MCH: 30.6 pg (ref 26.0–34.0)
MCHC: 35.3 g/dL (ref 32.0–36.0)
MCV: 86.6 fL (ref 80.0–100.0)
Platelets: 357 10*3/uL (ref 150–440)
RBC: 4.16 MIL/uL (ref 3.80–5.20)
RDW: 13.3 % (ref 11.5–14.5)
WBC: 8.8 10*3/uL (ref 3.6–11.0)

## 2015-09-24 LAB — POCT PREGNANCY, URINE: Preg Test, Ur: NEGATIVE

## 2015-09-24 LAB — URINALYSIS COMPLETE WITH MICROSCOPIC (ARMC ONLY)
Bilirubin Urine: NEGATIVE
GLUCOSE, UA: NEGATIVE mg/dL
Ketones, ur: NEGATIVE mg/dL
NITRITE: NEGATIVE
Protein, ur: NEGATIVE mg/dL
SPECIFIC GRAVITY, URINE: 1.008 (ref 1.005–1.030)
pH: 6 (ref 5.0–8.0)

## 2015-09-24 LAB — COMPREHENSIVE METABOLIC PANEL
ALK PHOS: 76 U/L (ref 38–126)
ALT: 25 U/L (ref 14–54)
ANION GAP: 8 (ref 5–15)
AST: 35 U/L (ref 15–41)
Albumin: 4.4 g/dL (ref 3.5–5.0)
BILIRUBIN TOTAL: 0.8 mg/dL (ref 0.3–1.2)
BUN: 9 mg/dL (ref 6–20)
CALCIUM: 9 mg/dL (ref 8.9–10.3)
CO2: 24 mmol/L (ref 22–32)
Chloride: 109 mmol/L (ref 101–111)
Creatinine, Ser: 0.71 mg/dL (ref 0.44–1.00)
Glucose, Bld: 101 mg/dL — ABNORMAL HIGH (ref 65–99)
POTASSIUM: 4 mmol/L (ref 3.5–5.1)
Sodium: 141 mmol/L (ref 135–145)
TOTAL PROTEIN: 7.2 g/dL (ref 6.5–8.1)

## 2015-09-24 LAB — LIPASE, BLOOD: Lipase: 21 U/L (ref 11–51)

## 2015-09-24 LAB — PREGNANCY, URINE: PREG TEST UR: NEGATIVE

## 2015-09-24 MED ORDER — ONDANSETRON HCL 4 MG/2ML IJ SOLN
4.0000 mg | Freq: Once | INTRAMUSCULAR | Status: AC
  Administered 2015-09-24: 4 mg via INTRAVENOUS
  Filled 2015-09-24: qty 2

## 2015-09-24 MED ORDER — SODIUM CHLORIDE 0.9 % IV BOLUS (SEPSIS)
1000.0000 mL | Freq: Once | INTRAVENOUS | Status: AC
  Administered 2015-09-24: 1000 mL via INTRAVENOUS

## 2015-09-24 MED ORDER — LORAZEPAM 1 MG PO TABS: 1.0000 mg | ORAL_TABLET | Freq: Three times a day (TID) | ORAL | Status: DC | PRN

## 2015-09-24 MED ORDER — ONDANSETRON 4 MG PO TBDP: 4.0000 mg | ORAL_TABLET | Freq: Four times a day (QID) | ORAL | Status: DC | PRN

## 2015-09-24 MED ORDER — DIPHENHYDRAMINE HCL 50 MG/ML IJ SOLN
25.0000 mg | Freq: Once | INTRAMUSCULAR | Status: AC
  Administered 2015-09-24: 25 mg via INTRAVENOUS
  Filled 2015-09-24: qty 1

## 2015-09-24 MED ORDER — LORAZEPAM 2 MG/ML IJ SOLN
1.0000 mg | Freq: Once | INTRAMUSCULAR | Status: AC
  Administered 2015-09-24: 1 mg via INTRAVENOUS
  Filled 2015-09-24: qty 1

## 2015-09-24 MED ORDER — MORPHINE SULFATE (PF) 4 MG/ML IV SOLN
4.0000 mg | Freq: Once | INTRAVENOUS | Status: AC
  Administered 2015-09-24: 4 mg via INTRAVENOUS
  Filled 2015-09-24: qty 1

## 2015-09-24 MED ORDER — LORAZEPAM 2 MG/ML IJ SOLN
1.0000 mg | INTRAMUSCULAR | Status: AC
  Administered 2015-09-24: 1 mg via INTRAVENOUS
  Filled 2015-09-24: qty 1

## 2015-09-24 MED ORDER — ACETAMINOPHEN 500 MG PO TABS
1000.0000 mg | ORAL_TABLET | ORAL | Status: AC
  Administered 2015-09-24: 1000 mg via ORAL
  Filled 2015-09-24: qty 2

## 2015-09-24 MED ORDER — METOCLOPRAMIDE HCL 5 MG/ML IJ SOLN
10.0000 mg | Freq: Once | INTRAMUSCULAR | Status: AC
  Administered 2015-09-24: 10 mg via INTRAVENOUS
  Filled 2015-09-24: qty 2

## 2015-09-24 NOTE — Discharge Instructions (Signed)
You were seen in the emergency room for nausea, vomiting, and hand/foot spasms. It is important that you follow up closely with your primary care doctor in the next couple of days or with the ER/walk-in clinic.  If you're unable to see her primary care doctor you may return to the emergency room or go to the [facility] walk-in clinic in 1 or 2 days for reexam.  Please return to the emergency room right away if you are to develop a fever, severe nausea, your pain becomes severe or worsens, you are unable to keep food down, begin vomiting any dark or bloody fluid, you develop any dark or bloody stools, feel dehydrated, or other new concerns or symptoms arise.   Nausea and Vomiting Nausea is a sick feeling that often comes before throwing up (vomiting). Vomiting is a reflex where stomach contents come out of your mouth. Vomiting can cause severe loss of body fluids (dehydration). Children and elderly adults can become dehydrated quickly, especially if they also have diarrhea. Nausea and vomiting are symptoms of a condition or disease. It is important to find the cause of your symptoms. CAUSES   Direct irritation of the stomach lining. This irritation can result from increased acid production (gastroesophageal reflux disease), infection, food poisoning, taking certain medicines (such as nonsteroidal anti-inflammatory drugs), alcohol use, or tobacco use.  Signals from the brain.These signals could be caused by a headache, heat exposure, an inner ear disturbance, increased pressure in the brain from injury, infection, a tumor, or a concussion, pain, emotional stimulus, or metabolic problems.  An obstruction in the gastrointestinal tract (bowel obstruction).  Illnesses such as diabetes, hepatitis, gallbladder problems, appendicitis, kidney problems, cancer, sepsis, atypical symptoms of a heart attack, or eating disorders.  Medical treatments such as chemotherapy and radiation.  Receiving medicine that  makes you sleep (general anesthetic) during surgery. DIAGNOSIS Your caregiver may ask for tests to be done if the problems do not improve after a few days. Tests may also be done if symptoms are severe or if the reason for the nausea and vomiting is not clear. Tests may include:  Urine tests.  Blood tests.  Stool tests.  Cultures (to look for evidence of infection).  X-rays or other imaging studies. Test results can help your caregiver make decisions about treatment or the need for additional tests. TREATMENT You need to stay well hydrated. Drink frequently but in small amounts.You may wish to drink water, sports drinks, clear broth, or eat frozen ice pops or gelatin dessert to help stay hydrated.When you eat, eating slowly may help prevent nausea.There are also some antinausea medicines that may help prevent nausea. HOME CARE INSTRUCTIONS   Take all medicine as directed by your caregiver.  If you do not have an appetite, do not force yourself to eat. However, you must continue to drink fluids.  If you have an appetite, eat a normal diet unless your caregiver tells you differently.  Eat a variety of complex carbohydrates (rice, wheat, potatoes, bread), lean meats, yogurt, fruits, and vegetables.  Avoid high-fat foods because they are more difficult to digest.  Drink enough water and fluids to keep your urine clear or pale yellow.  If you are dehydrated, ask your caregiver for specific rehydration instructions. Signs of dehydration may include:  Severe thirst.  Dry lips and mouth.  Dizziness.  Dark urine.  Decreasing urine frequency and amount.  Confusion.  Rapid breathing or pulse. SEEK IMMEDIATE MEDICAL CARE IF:   You have blood or brown  flecks (like coffee grounds) in your vomit.  You have black or bloody stools.  You have a severe headache or stiff neck.  You are confused.  You have severe abdominal pain.  You have chest pain or trouble breathing.  You  do not urinate at least once every 8 hours.  You develop cold or clammy skin.  You continue to vomit for longer than 24 to 48 hours.  You have a fever. MAKE SURE YOU:   Understand these instructions.  Will watch your condition.  Will get help right away if you are not doing well or get worse.   This information is not intended to replace advice given to you by your health care provider. Make sure you discuss any questions you have with your health care provider.   Document Released: 02/25/2005 Document Revised: 05/20/2011 Document Reviewed: 07/25/2010 Elsevier Interactive Patient Education [employer]

## 2015-09-24 NOTE — ED Notes (Signed)

## 2015-09-24 NOTE — ED Notes (Signed)
Pt crying in room after morphine and reglan IV. Pt states she thinks one of them made her spasms much worse than before and now it is unbearable. EDP notified, at bedside speaking with pt.

## 2015-09-24 NOTE — ED Notes (Signed)
Pt reports vomiting multiple times since yesterday. Denies diarrhea states she feels all of her muscles are "cramping up on me" Denies fever

## 2015-09-24 NOTE — ED Provider Notes (Signed)
[facility] Emergency Department Provider Note  ____________________________________________  Time seen: Approximately 9:25 AM  I have reviewed the triage vital signs and the nursing notes.   HISTORY  Chief Complaint Emesis    HPI Jessica Wong is a 30 y.o. female the previous history of asthma.  Patient reports that [place] [facility] [place] During the last day she started developing nausea and vomiting several times yesterday in the morning, and throughout the day. Nonbloody and reports she can keep food down well. She then started to develop a feeling of a mild headache, described as a light throbbing sensation, with ongoing nausea and a couple of loose watery stools which have resolved after taking Imodium.  She also reports that last night she began having episodes where she would start having spasm and tingling in both hands and feet. She thought this was possibly due to some anxiety and took a Valium 5 mg which she had at home. She reports that helped slightly, but this morning again she started feeling very anxious and started having cramping in her hands and feet again.  She does not know when her last tetanus shot was, but she reports no puncture wounds, open wounds, rashes or injuries. She denies having any obvious exposure to bad water. [place] [facility] and also was in the ocean but never waited in past or waist.  She denies fever. At the present time she reports that her nausea is improved slightly after Zofran. She has no abdominal pain right now. She did have a recent urinary tract infection, but she believes that that it cleared up. She is not a further dysuria. She denies pregnancy or vaginal bleeding. No pelvic pain.  No cough shortness of breath or trouble breathing. She did have one day of the beach where she felt like she had a small amount of asthma and used her  inhaler. She presently reports no shortness of breath or wheezing. Denies any scratchy throat or difficulty swallowing. No neck pain or stiffness.  Past Medical History  Diagnosis Date  . Thyroid disease   . Asthma     There are no active problems to display for this patient.   Past Surgical History  Procedure Laterality Date  . Knee surgery Right   . Dilation and curettage of uterus    . Cholecystectomy      Current Outpatient Rx  Name  Route  Sig  Dispense  Refill  . albuterol (PROAIR HFA) 108 (90 Base) MCG/ACT inhaler   Inhalation   Inhale 2 puffs into the lungs every 6 (six) hours as needed. For wheezing.         Marland Kitchen. ibuprofen (ADVIL,MOTRIN) 200 MG tablet   Oral   Take 800 mg by mouth 2 (two) times daily.         . traMADol (ULTRAM) 50 MG tablet   Oral   Take 1 tablet (50 mg total) by mouth every 6 (six) hours as needed.   10 tablet   0   . LORazepam (ATIVAN) 1 MG tablet   Oral   Take 1 tablet (1 mg total) by mouth every 8 (eight) hours as needed for anxiety.   21 tablet   0   . ondansetron (ZOFRAN ODT) 4 MG disintegrating tablet   Oral   Take 1 tablet (4 mg total) by mouth every 6 (six) hours as needed for nausea  or vomiting.   20 tablet   0     Allergies Tramadol; Flagyl; Bactrim; and Tape  No family history on file.  Social History Social History  Substance Use Topics  . Smoking status: Never Smoker   . Smokeless tobacco: Never Used  . Alcohol Use: No    Review of Systems Constitutional: No fever/chills. Some fatigue and feeling slightly dehydrated. Eyes: No visual changes. ENT: No sore throat. Cardiovascular: Denies chest pain. Respiratory: Denies shortness of breath. See history of present illness Gastrointestinal: No abdominal pain.    No constipation. Genitourinary: Negative for dysuria. Musculoskeletal: Negative for back pain. Skin: Negative for rash. Neurological: Negative for headaches, focal weakness or numbness except for some  tingling in her hands and feet that occurs when she has "spasms".  10-point ROS otherwise negative.  ____________________________________________   PHYSICAL EXAM:  VITAL SIGNS: ED Triage Vitals  Enc Vitals Group     BP 09/24/15 0837 135/95 mmHg     Pulse Rate 09/24/15 0837 67     Resp 09/24/15 0837 20     Temp 09/24/15 0837 98.7 F (37.1 C)     Temp Source 09/24/15 0837 Oral     SpO2 09/24/15 0837 100 %     Weight 09/24/15 0837 200 lb (90.719 kg)     Height 09/24/15 0837 5\' 4"  (1.626 m)     Head Cir --      Peak Flow --      Pain Score 09/24/15 0839 7     Pain Loc --      Pain Edu? --      Excl. in GC? --    Constitutional: Alert and oriented. Well appearing and in no acute distress.Patient does appear to be in mild discomfort, reports to having cramping in her hands and feet. Eyes: Conjunctivae are normal. PERRL. EOMI. Head: Atraumatic. Nose: No congestion/rhinnorhea. Mouth/Throat: Mucous membranes are moist.  Oropharynx non-erythematous. Neck: No stridor.  No meningismus. Cardiovascular: Normal rate, regular rhythm. Grossly normal heart sounds.  Good peripheral circulation. Respiratory: Normal respiratory effort.  No retractions. Lungs CTAB. Gastrointestinal: Soft and nontender. No distention. No abdominal bruits. There is mild right-sided CVA tenderness, none the left. Negative Murphy. No rebound or guarding. No pain at McBurney's point. Very reassuring intra-abdominal exam without evidence of acute abdomen. Musculoskeletal: No lower extremity tenderness nor edema.  No joint effusions. Neurologic:  Normal speech and language. No gross focal neurologic deficits are appreciated. Normal cranial nerve exam. The patient does demonstrate some mild carpopedal spasm in both hands and feet. After talking and reassurance, her symptoms started to subside. She did report a slight tingling feeling in the hands and feet associated with this, but after about 5 minutes the symptoms went away  after discussing with her. Skin:  Skin is warm, dry and intact. No rash noted. Psychiatric: Mood and affect are normal. Speech and behavior are normal.  ____________________________________________   LABS (all labs ordered are listed, but only abnormal results are displayed)  Labs Reviewed  COMPREHENSIVE METABOLIC PANEL - Abnormal; Notable for the following:    Glucose, Bld 101 (*)    All other components within normal limits  URINALYSIS COMPLETEWITH MICROSCOPIC ([facility] ONLY) - Abnormal; Notable for the following:    Color, Urine STRAW (*)    APPearance CLEAR (*)    Hgb urine dipstick 1+ (*)    Leukocytes, UA TRACE (*)    Bacteria, UA RARE (*)    Squamous Epithelial / LPF 0-5 (*)  All other components within normal limits  LIPASE, BLOOD  CBC  PREGNANCY, URINE  POC URINE PREG, ED  POCT PREGNANCY, URINE   ____________________________________________  EKG   ____________________________________________  RADIOLOGY  I see no clear indication for abdominal imaging at this time.   I discussed with the patient the risks and benefits of abdominal CT scan. The present time there is no clear indication that the patient requires CT, the patient does have an abdominal complaint but exam does not suggest acute surgical abdomen and my suspicion for intra-abdominal infection including appendicitis, cholecystitis, aaa, dissection, ischemia, perforation, pancreatitis, diverticulitis or other acute major intra-abdominal process is quite low. After discussing the risks and benefits including benefits of additional evaluation for diagnoses, ruling out infection/perforation/aaa/etc, but also discussing the risks including low, "well less than 1%," but not 0 risk of inducing cancers due to radiation and potential risks of contrast the patient indicated via our shared medical decision-making that she would not do a CAT scan. Rather if the patient does have worsening symptoms, develops a high fever,  develops pain or persistent discomfort in the right upper quadrant or right lower quadrant, or other new concerns arise they will come back to emergency room right away. As the patient's clinician I think this is a very reasonable decision having discussed general risks and benefits of CT, and my clinical suspicion that CT would be of benefit at this time is very low.  ____________________________________________   PROCEDURES  Procedure(s) performed: None  Critical Care performed: No  ____________________________________________   INITIAL IMPRESSION / ASSESSMENT AND PLAN / ED COURSE  Pertinent labs & imaging results that were available during my care of the patient were reviewed by me and considered in my medical decision making (see chart for details).  Patient presents for evaluation of mild headache, feeling dehydrated after developing acute onset of nausea and vomiting with a couple loose stools over the last day. Clinical history and examination does appear to have an demonstrate some carpopedal spasm which was relieved after reassurance and discussion with the patient. I do not believe she is at risk for tetanus, she has no open wounds, areas of rash, or exposure to any puncture type wound or other injury. She was at the [facility] but did not spend much time in the water, in addition she denies any water exposure, and was in the ocean but only waited and up into the level of her waist.  He most suspect acute gastrointestinal illness with some mild dehydration, possibly self-limited. She is very stable and nontoxic appearing in nature. No obvious the country travel, no drinking of bad water sources that she is aware of. Presently he has not had another stool today after taking Imodium.  We'll check basic labs, she does appear anxious now treated with Ativan for this as she carries a history of anxiety, and addition I am slightly suspicious about the possibility of UTI so we'll check a  urinalysis pregnancy test. And reevaluate the patient closely.  ----------------------------------------- 1:53 PM on 09/24/2015 -----------------------------------------  Patient is resting comfortably. She reports she feels much better. She feels like she is rating her home. On reexam she is awake alert, no further evidence of any carpopedal spasm. She is resting very comfortably and very pleasantly with her husband. I discussed very strict return precautions, treatment for her dehydration, and also advised her discussed risks of treatment with Ativan for what appears to be some associated anxiety. She demonstrates no opisthotonus, stiff neck, meningismus, abdominal  rigidity, locked jaw or any other symptoms to support a diagnosis such as tetanus, meningitis, or other acute bacterial infection. She will treat her symptoms, monitor symptoms closely. Not take Ativan 1 once every 8 hours agreeable not driving or taking with any others medications to make her sleepy. She did have a prescription for Valium but that ran out yesterday, for which she only had it from an old back spasm injury.  Patient is agreeable and repeats treatment plan. Return precautions and treatment recommendations and follow-up discussed with the patient who is agreeable with the plan.  ____________________________________________   FINAL CLINICAL IMPRESSION(S) / ED DIAGNOSES  Final diagnoses:  Non-intractable vomiting with nausea, vomiting of unspecified type      Sharyn Creamer, MD 09/24/15 1355

## 2015-11-12 ENCOUNTER — Encounter: Payer: Self-pay | Admitting: Emergency Medicine

## 2015-11-12 ENCOUNTER — Emergency Department
Admission: EM | Admit: 2015-11-12 | Discharge: 2015-11-12 | Disposition: A | Discharge status: Home / Self Care | Payer: Self-pay | Attending: Emergency Medicine | Admitting: Emergency Medicine

## 2015-11-12 DIAGNOSIS — B379 Candidiasis, unspecified: Secondary | ICD-10-CM | POA: Insufficient documentation

## 2015-11-12 DIAGNOSIS — R21 Rash and other nonspecific skin eruption: Secondary | ICD-10-CM

## 2015-11-12 DIAGNOSIS — B372 Candidiasis of skin and nail: Secondary | ICD-10-CM

## 2015-11-12 DIAGNOSIS — J45909 Unspecified asthma, uncomplicated: Secondary | ICD-10-CM | POA: Insufficient documentation

## 2015-11-12 DIAGNOSIS — Z79899 Other long term (current) drug therapy: Secondary | ICD-10-CM | POA: Insufficient documentation

## 2015-11-12 DIAGNOSIS — Z760 Encounter for issue of repeat prescription: Secondary | ICD-10-CM

## 2015-11-12 LAB — BASIC METABOLIC PANEL
ANION GAP: 6 (ref 5–15)
BUN: 10 mg/dL (ref 6–20)
CHLORIDE: 104 mmol/L (ref 101–111)
CO2: 29 mmol/L (ref 22–32)
CREATININE: 0.75 mg/dL (ref 0.44–1.00)
Calcium: 8.7 mg/dL — ABNORMAL LOW (ref 8.9–10.3)
GFR calc non Af Amer: >60 mL/min (ref >60)
Glucose, Bld: 92 mg/dL (ref 65–99)
POTASSIUM: 3.5 mmol/L (ref 3.5–5.1)
SODIUM: 139 mmol/L (ref 135–145)

## 2015-11-12 LAB — CBC WITH DIFFERENTIAL/PLATELET
BASOS ABS: 0 10*3/uL (ref 0–0.1)
BASOS PCT: 0 %
EOS ABS: 0.3 10*3/uL (ref 0–0.7)
Eosinophils Relative: 5 %
HCT: 33.2 % — ABNORMAL LOW (ref 35.0–47.0)
HEMOGLOBIN: 12 g/dL (ref 12.0–16.0)
Lymphocytes Relative: 25 %
Lymphs Abs: 1.9 10*3/uL (ref 1.0–3.6)
MCH: 31.3 pg (ref 26.0–34.0)
MCHC: 36.1 g/dL — ABNORMAL HIGH (ref 32.0–36.0)
MCV: 86.5 fL (ref 80.0–100.0)
Monocytes Absolute: 0.4 10*3/uL (ref 0.2–0.9)
Monocytes Relative: 6 %
NEUTROS PCT: 64 %
Neutro Abs: 4.8 10*3/uL (ref 1.4–6.5)
Platelets: 317 10*3/uL (ref 150–440)
RBC: 3.83 MIL/uL (ref 3.80–5.20)
RDW: 13 % (ref 11.5–14.5)
WBC: 7.5 10*3/uL (ref 3.6–11.0)

## 2015-11-12 MED ORDER — HYDROXYZINE HCL 25 MG PO TABS
25.0000 mg | ORAL_TABLET | Freq: Once | ORAL | Status: AC
  Administered 2015-11-12: 25 mg via ORAL
  Filled 2015-11-12: qty 1

## 2015-11-12 MED ORDER — HYDROXYZINE HCL 25 MG PO TABS: 25.0000 mg | ORAL_TABLET | Freq: Four times a day (QID) | ORAL | 0 refills | Status: DC | PRN

## 2015-11-12 MED ORDER — CLOTRIMAZOLE 1 % EX CREA: TOPICAL_CREAM | CUTANEOUS | 0 refills | Status: DC

## 2015-11-12 MED ORDER — OXYCODONE HCL 5 MG PO TABS: 5.0000 mg | ORAL_TABLET | ORAL | 0 refills | Status: DC | PRN

## 2015-11-12 NOTE — ED Notes (Signed)
Pt verbalized understanding of discharge instructions. NAD at this time. 

## 2015-11-12 NOTE — ED Triage Notes (Signed)
Pt had appendectomy on Tuesday.  Pt states she noticed rash to abdomen that started around umbilicus and moving down towards her legs. Pt c/o itching and pain from surgery.  Pt denies any drainage.

## 2015-11-12 NOTE — ED Provider Notes (Signed)
[facility] Emergency Department Provider Note  ____________________________________________   First MD Initiated Contact with Patient 11/12/15 1240     (approximate)  I have reviewed the triage vital signs and the nursing notes.   HISTORY  Chief Complaint Rash   HPI Jessica Wong is a 30 y.o. female is here with complaint of rash. Patient states thatshe had an [place] 5 [place] ago [place] and has been taking Roxicodone for pain. Patient states that she is out of pain medication and that the doctor on call advised her to go to the nearest emergency room. She also told them that she had a rash that started around her umbilicus and moved down toward her legs. Patient complains of itching with the rash. She also has a rash under her breast that does not itch. She denies any drainage from the incision sites. She states that she is sore in her abdomen but otherwise has not had any fever, chills, nausea or vomiting. Patient states that when she had her cholecystectomy she had cellulitis and is worried about this.she took Benadryl earlier this morning for itching which has helped some.she rates her discomfor as a 7/10.   Past Medical History:  Diagnosis Date  . Asthma   . Thyroid disease     There are no active problems to display for this patient.   Past Surgical History:  Procedure Laterality Date  . APPENDECTOMY    . CHOLECYSTECTOMY    . DILATION AND CURETTAGE OF UTERUS    . KNEE SURGERY Right     Prior to Admission medications   Medication Sig Start Date End Date Taking? Authorizing Provider  albuterol (PROAIR HFA) 108 (90 Base) MCG/ACT inhaler Inhale 2 puffs into the lungs every 6 (six) hours as needed. For wheezing.    Historical Provider, MD  clotrimazole (LOTRIMIN) 1 % cream Applied to areas beneath the breasts twice a day for rash. 11/12/15   Tommi Rumpshonda L Delisia Mcquiston, PA-C  hydrOXYzine (ATARAX/VISTARIL) 25 MG tablet Take 1 tablet (25 mg total)  by mouth every 6 (six) hours as needed for itching. 11/12/15   Tommi Rumpshonda L Evelin Cake, PA-C  ibuprofen (ADVIL,MOTRIN) 200 MG tablet Take 800 mg by mouth 2 (two) times daily.    Historical Provider, MD  LORazepam (ATIVAN) 1 MG tablet Take 1 tablet (1 mg total) by mouth every 8 (eight) hours as needed for anxiety. 09/24/15 09/23/16  Sharyn CreamerMark Quale, MD  ondansetron (ZOFRAN ODT) 4 MG disintegrating tablet Take 1 tablet (4 mg total) by mouth every 6 (six) hours as needed for nausea or vomiting. 09/24/15   Sharyn CreamerMark Quale, MD  oxyCODONE (ROXICODONE) 5 MG immediate release tablet Take 1 tablet (5 mg total) by mouth every 4 (four) hours as needed. 11/12/15 11/11/16  Tommi Rumpshonda L Tristyn Pharris, PA-C  traMADol (ULTRAM) 50 MG tablet Take 1 tablet (50 mg total) by mouth every 6 (six) hours as needed. 08/24/15   Jami L Hagler, PA-C    Allergies Tramadol; Flagyl [metronidazole]; Bactrim [sulfamethoxazole-trimethoprim]; and Tape  No family history on file.  Social History Social History  Substance Use Topics  . Smoking status: Never Smoker  . Smokeless tobacco: Never Used  . Alcohol use No    Review of Systems Constitutional: No fever/chills Cardiovascular: Denies chest pain. Respiratory: Denies shortness of breath. Gastrointestinal: No abdominal pain.  No nausea, no vomiting.   Genitourinary: Negative for dysuria. Musculoskeletal: Negative for back pain. Skin: positive for rash area positive for itching. Neurological: Negative for headaches,  focal weakness or numbness.  10-point ROS otherwise negative.  ____________________________________________   PHYSICAL EXAM:  VITAL SIGNS: ED Triage Vitals  Enc Vitals Group     BP 11/12/15 1159 132/83     Pulse Rate 11/12/15 1159 73     Resp 11/12/15 1159 16     Temp 11/12/15 1159 98 F (36.7 C)     Temp Source 11/12/15 1159 Oral     SpO2 11/12/15 1159 97 %     Weight 11/12/15 1200 210 lb (95.3 kg)     Height 11/12/15 1200 5\' 4"  (1.626 m)     Head Circumference --      Peak  Flow --      Pain Score 11/12/15 1209 7     Pain Loc --      Pain Edu? --      Excl. in GC? --     Constitutional: Alert and oriented. Well appearing and in no acute distress. Eyes: Conjunctivae are normal. PERRL. EOMI. Head: Atraumatic. Nose: No congestion/rhinnorhea. Neck: No stridor.   Cardiovascular: Normal rate, regular rhythm. Grossly normal heart sounds.  Good peripheral circulation. Respiratory: Normal respiratory effort.  No retractions. Lungs CTAB. Gastrointestinal: Soft and nontender. No distention.  Musculoskeletal: Moves upper and lower extremities without any difficulty. Normal gait was noted. Neurologic:  Normal speech and language. No gross focal neurologic deficits are appreciated.  Skin:  Skin is warm, dry. Skin tissue just beneath the breasts bilaterally are erythematous with macular areas that appear wet. No satellite lesions were noted. There is also a diffuse erythematous papular rash scattered over the abdomen but not involving the back or upper extremities. There is an occasional erythematous papule noted on the upper thigh. Incision site from her recent surgery is healing without any signs of infection. There is a resolving ecchymotic area noted in the left lower quadrant and right lower quadrant resulting from her surgery. Psychiatric: Mood and affect are normal. Speech and behavior are normal.  ____________________________________________   LABS (all labs ordered are listed, but only abnormal results are displayed)  Labs Reviewed  CBC WITH DIFFERENTIAL/PLATELET - Abnormal; Notable for the following:       Result Value   HCT 33.2 (*)    MCHC 36.1 (*)    All other components within normal limits  BASIC METABOLIC PANEL - Abnormal; Notable for the following:    Calcium 8.7 (*)    All other components within normal limits    PROCEDURES  Procedure(s) performed: None  Procedures  Critical Care performed:  No  ____________________________________________   INITIAL IMPRESSION / ASSESSMENT AND PLAN / ED COURSE  Pertinent labs & imaging results that were available during my care of the patient were reviewed by me and considered in my medical decision making (see chart for details).    Clinical Course   Patient was given Atarax 25 mg while in the emergency room and states that this helped with her itching prior to her discharge. Patient was reassured that her white count was not elevated and that there were no signs of cellulitis. Patient was given a prescription for Lotrimin cream to applied to the areas underneath her breast to clear this rash. She is also to continue taking Atarax for her itching and follow-up with her doctor at [facility] for any continued problems.  ____________________________________________   FINAL CLINICAL IMPRESSION(S) / ED DIAGNOSES  Final diagnoses:  Monilial rash  Rash and nonspecific skin eruption  Encounter for medication refill  NEW MEDICATIONS STARTED DURING THIS VISIT:  Discharge Medication List as of 11/12/2015  2:06 PM    START taking these medications   Details  clotrimazole (LOTRIMIN) 1 % cream Applied to areas beneath the breasts twice a day for rash., Print    hydrOXYzine (ATARAX/VISTARIL) 25 MG tablet Take 1 tablet (25 mg total) by mouth every 6 (six) hours as needed for itching., Starting Sun 11/12/2015, Print    oxyCODONE (ROXICODONE) 5 MG immediate release tablet Take 1 tablet (5 mg total) by mouth every 4 (four) hours as needed., Starting Sun 11/12/2015, Until Mon 11/11/2016, Print         Note:  This document was prepared using Dragon voice recognition software and may include unintentional dictation errors.    Tommi Rumps, PA-C 11/12/15 1503    Nita Sickle, MD 11/12/15 2204

## 2015-11-23 ENCOUNTER — Emergency Department: Payer: Self-pay

## 2015-11-23 ENCOUNTER — Emergency Department
Admission: EM | Admit: 2015-11-23 | Discharge: 2015-11-23 | Disposition: A | Discharge status: Home / Self Care | Payer: Self-pay | Attending: Emergency Medicine | Admitting: Emergency Medicine

## 2015-11-23 DIAGNOSIS — J45909 Unspecified asthma, uncomplicated: Secondary | ICD-10-CM | POA: Insufficient documentation

## 2015-11-23 DIAGNOSIS — Z79899 Other long term (current) drug therapy: Secondary | ICD-10-CM | POA: Insufficient documentation

## 2015-11-23 DIAGNOSIS — T43631A Poisoning by methylphenidate, accidental (unintentional), initial encounter: Secondary | ICD-10-CM | POA: Insufficient documentation

## 2015-11-23 DIAGNOSIS — R079 Chest pain, unspecified: Secondary | ICD-10-CM

## 2015-11-23 DIAGNOSIS — T50901A Poisoning by unspecified drugs, medicaments and biological substances, accidental (unintentional), initial encounter: Secondary | ICD-10-CM

## 2015-11-23 LAB — BASIC METABOLIC PANEL
ANION GAP: 7 (ref 5–15)
BUN: 10 mg/dL (ref 6–20)
CO2: 24 mmol/L (ref 22–32)
Calcium: 9.2 mg/dL (ref 8.9–10.3)
Chloride: 107 mmol/L (ref 101–111)
Creatinine, Ser: 0.81 mg/dL (ref 0.44–1.00)
GFR calc Af Amer: >60 mL/min (ref >60)
GFR calc non Af Amer: >60 mL/min (ref >60)
GLUCOSE: 112 mg/dL — AB (ref 65–99)
POTASSIUM: 3 mmol/L — AB (ref 3.5–5.1)
Sodium: 138 mmol/L (ref 135–145)

## 2015-11-23 LAB — CBC
HEMATOCRIT: 35.5 % (ref 35.0–47.0)
HEMOGLOBIN: 12.8 g/dL (ref 12.0–16.0)
MCH: 30.9 pg (ref 26.0–34.0)
MCHC: 36.1 g/dL — AB (ref 32.0–36.0)
MCV: 85.6 fL (ref 80.0–100.0)
Platelets: 299 10*3/uL (ref 150–440)
RBC: 4.15 MIL/uL (ref 3.80–5.20)
RDW: 12.5 % (ref 11.5–14.5)
WBC: 7.6 10*3/uL (ref 3.6–11.0)

## 2015-11-23 LAB — TROPONIN I: Troponin I: <0.03 ng/mL (ref <0.03)

## 2015-11-23 MED ORDER — LORAZEPAM 2 MG/ML IJ SOLN
1.0000 mg | Freq: Once | INTRAMUSCULAR | Status: AC
  Administered 2015-11-23: 1 mg via INTRAVENOUS
  Filled 2015-11-23: qty 1

## 2015-11-23 NOTE — ED Triage Notes (Signed)
Patient ambulatory to triage with steady gait, without difficulty or distress noted; pt reports mid CP radiating down left arm since yesterday with no accomp symptoms

## 2015-11-24 NOTE — ED Provider Notes (Signed)
[facility] Emergency Department Provider Note    First MD Initiated Contact with Patient 11/23/15 [phone]     (approximate)  I have reviewed the triage vital signs and the nursing notes.   HISTORY  Chief Complaint Chest Pain    HPI Jessica Wong is a 30 y.o. female history of anxiety, asthma presents to the emergency department with chest tightness after accidentally taking 2 of her child's Focalin   Past Medical History:  Diagnosis Date  . Asthma   . Thyroid disease     There are no active problems to display for this patient.   Past Surgical History:  Procedure Laterality Date  . APPENDECTOMY    . CHOLECYSTECTOMY    . DILATION AND CURETTAGE OF UTERUS    . KNEE SURGERY Right     Prior to Admission medications   Medication Sig Start Date End Date Taking? Authorizing Provider  albuterol (PROAIR HFA) 108 (90 Base) MCG/ACT inhaler Inhale 2 puffs into the lungs every 6 (six) hours as needed. For wheezing.    Historical Provider, MD  clotrimazole (LOTRIMIN) 1 % cream Applied to areas beneath the breasts twice a day for rash. 11/12/15   Tommi Rumpshonda L Summers, PA-C  hydrOXYzine (ATARAX/VISTARIL) 25 MG tablet Take 1 tablet (25 mg total) by mouth every 6 (six) hours as needed for itching. 11/12/15   Tommi Rumpshonda L Summers, PA-C  ibuprofen (ADVIL,MOTRIN) 200 MG tablet Take 800 mg by mouth 2 (two) times daily.    Historical Provider, MD  LORazepam (ATIVAN) 1 MG tablet Take 1 tablet (1 mg total) by mouth every 8 (eight) hours as needed for anxiety. 09/24/15 09/23/16  Sharyn CreamerMark Quale, MD  ondansetron (ZOFRAN ODT) 4 MG disintegrating tablet Take 1 tablet (4 mg total) by mouth every 6 (six) hours as needed for nausea or vomiting. 09/24/15   Sharyn CreamerMark Quale, MD  oxyCODONE (ROXICODONE) 5 MG immediate release tablet Take 1 tablet (5 mg total) by mouth every 4 (four) hours as needed. 11/12/15 11/11/16  Tommi Rumpshonda L Summers, PA-C  traMADol (ULTRAM) 50 MG tablet Take 1 tablet (50 mg total) by mouth  every 6 (six) hours as needed. 08/24/15   Jami L Hagler, PA-C    Allergies Tramadol; Flagyl [metronidazole]; Bactrim [sulfamethoxazole-trimethoprim]; and Tape  No family history on file.  Social History Social History  Substance Use Topics  . Smoking status: Never Smoker  . Smokeless tobacco: Never Used  . Alcohol use No    Review of Systems Constitutional: No fever/chills Eyes: No visual changes. ENT: No sore throat. Cardiovascular: Denies chest pain. Respiratory: Denies shortness of breath. Gastrointestinal: No abdominal pain.  No nausea, no vomiting.  No diarrhea.  No constipation. Genitourinary: Negative for dysuria. Musculoskeletal: Negative for back pain. Skin: Negative for rash. Neurological: Negative for headaches, focal weakness or numbness.  10-point ROS otherwise negative.  ____________________________________________   PHYSICAL EXAM:  VITAL SIGNS: ED Triage Vitals  Enc Vitals Group     BP 11/23/15 0358 123/86     Pulse Rate 11/23/15 0358 81     Resp 11/23/15 0358 19     Temp 11/23/15 0358 98.2 F (36.8 C)     Temp Source 11/23/15 0358 Oral     SpO2 11/23/15 0358 100 %     Weight 11/23/15 0346 205 lb (93 kg)     Height 11/23/15 0346 5\' 4"  (1.626 m)     Head Circumference --      Peak Flow --  Pain Score 11/23/15 0346 9     Pain Loc --      Pain Edu? --      Excl. in GC? --     Constitutional: Alert and oriented. Well appearing and in no acute distress. Eyes: Conjunctivae are normal. PERRL. EOMI. Head: Atraumatic. Ears:  Healthy appearing ear canals and TMs bilaterally Nose: No congestion/rhinnorhea. Mouth/Throat: Mucous membranes are moist.  Oropharynx non-erythematous. Neck: No stridor.  No meningeal signs.  Cardiovascular: Normal rate, regular rhythm. Good peripheral circulation. Grossly normal heart sounds. Respiratory: Normal respiratory effort.  No retractions. Lungs CTAB. Gastrointestinal: Soft and nontender. No distention.    Musculoskeletal: No lower extremity tenderness nor edema. No gross deformities of extremities. Neurologic:  Normal speech and language. No gross focal neurologic deficits are appreciated.  Skin:  Skin is warm, dry and intact. No rash noted. Psychiatric: Anxious affect. Speech and behavior are normal.  ____________________________________________   LABS (all labs ordered are listed, but only abnormal results are displayed)  Labs Reviewed  BASIC METABOLIC PANEL - Abnormal; Notable for the following:       Result Value   Potassium 3.0 (*)    Glucose, Bld 112 (*)    All other components within normal limits  CBC - Abnormal; Notable for the following:    MCHC 36.1 (*)    All other components within normal limits  TROPONIN I   ____________________________________________  EKG  ED ECG REPORT I, Roundup N Caron Ode, the attending physician, personally viewed and interpreted this ECG.   Date: 11/24/2015  EKG Time: 3:55AM  Rate: 74  Rhythm: Normal sinus rhythm  Axis: Normal  Intervals:Normal  ST&T Change: None  ____________________________________________   Procedures     INITIAL IMPRESSION / ASSESSMENT AND PLAN / ED COURSE  Pertinent labs & imaging results that were available during my care of the patient were reviewed by me and considered in my medical decision making (see chart for details).  EKG negative Troponin negative   Clinical Course    ____________________________________________  FINAL CLINICAL IMPRESSION(S) / ED DIAGNOSES  Final diagnoses:  Medication overdose, accidental or unintentional, initial encounter  Chest pain, unspecified chest pain type     MEDICATIONS GIVEN DURING THIS VISIT:  Medications  LORazepam (ATIVAN) injection 1 mg (1 mg Intravenous Given 11/23/15 0506)     NEW OUTPATIENT MEDICATIONS STARTED DURING THIS VISIT:  Discharge Medication List as of 11/23/2015  6:30 AM      Discharge Medication List as of 11/23/2015  6:30 AM       Discharge Medication List as of 11/23/2015  6:30 AM       Note:  This document was prepared using Dragon voice recognition software and may include unintentional dictation errors.    Darci Current, MD 11/24/15 [phone]

## 2015-12-07 MED ORDER — DIAZEPAM 5 MG PO TABS
ORAL_TABLET | ORAL | Status: AC
Start: 2015-12-07 — End: 2015-12-07
  Filled 2015-12-07: qty 1

## 2016-02-13 ENCOUNTER — Emergency Department
Admission: EM | Admit: 2016-02-13 | Discharge: 2016-02-13 | Disposition: A | Discharge status: Home / Self Care | Payer: Self-pay | Attending: Emergency Medicine | Admitting: Emergency Medicine

## 2016-02-13 ENCOUNTER — Emergency Department: Payer: Self-pay

## 2016-02-13 ENCOUNTER — Encounter: Payer: Self-pay | Admitting: Emergency Medicine

## 2016-02-13 DIAGNOSIS — Z79899 Other long term (current) drug therapy: Secondary | ICD-10-CM | POA: Insufficient documentation

## 2016-02-13 DIAGNOSIS — J45909 Unspecified asthma, uncomplicated: Secondary | ICD-10-CM | POA: Insufficient documentation

## 2016-02-13 DIAGNOSIS — M545 Low back pain: Secondary | ICD-10-CM | POA: Insufficient documentation

## 2016-02-13 DIAGNOSIS — Z791 Long term (current) use of non-steroidal anti-inflammatories (NSAID): Secondary | ICD-10-CM | POA: Insufficient documentation

## 2016-02-13 DIAGNOSIS — J189 Pneumonia, unspecified organism: Secondary | ICD-10-CM

## 2016-02-13 DIAGNOSIS — R0602 Shortness of breath: Secondary | ICD-10-CM

## 2016-02-13 DIAGNOSIS — J181 Lobar pneumonia, unspecified organism: Secondary | ICD-10-CM | POA: Insufficient documentation

## 2016-02-13 DIAGNOSIS — M549 Dorsalgia, unspecified: Secondary | ICD-10-CM

## 2016-02-13 LAB — URINALYSIS COMPLETE WITH MICROSCOPIC (ARMC ONLY)
BILIRUBIN URINE: NEGATIVE
Glucose, UA: NEGATIVE mg/dL
Nitrite: NEGATIVE
PH: 6 (ref 5.0–8.0)
Protein, ur: NEGATIVE mg/dL
RBC / HPF: NONE SEEN RBC/hpf (ref 0–5)
Specific Gravity, Urine: 1.005 (ref 1.005–1.030)

## 2016-02-13 LAB — BASIC METABOLIC PANEL
Anion gap: 9 (ref 5–15)
BUN: 7 mg/dL (ref 6–20)
CHLORIDE: 105 mmol/L (ref 101–111)
CO2: 23 mmol/L (ref 22–32)
CREATININE: 0.75 mg/dL (ref 0.44–1.00)
Calcium: 8.6 mg/dL — ABNORMAL LOW (ref 8.9–10.3)
GFR calc Af Amer: >60 mL/min (ref >60)
GFR calc non Af Amer: >60 mL/min (ref >60)
Glucose, Bld: 114 mg/dL — ABNORMAL HIGH (ref 65–99)
POTASSIUM: 2.8 mmol/L — AB (ref 3.5–5.1)
Sodium: 137 mmol/L (ref 135–145)

## 2016-02-13 LAB — CBC
HCT: 34.2 % — ABNORMAL LOW (ref 35.0–47.0)
Hemoglobin: 12.1 g/dL (ref 12.0–16.0)
MCH: 31.1 pg (ref 26.0–34.0)
MCHC: 35.3 g/dL (ref 32.0–36.0)
MCV: 87.9 fL (ref 80.0–100.0)
PLATELETS: 268 10*3/uL (ref 150–440)
RBC: 3.89 MIL/uL (ref 3.80–5.20)
RDW: 12.8 % (ref 11.5–14.5)
WBC: 19.1 10*3/uL — AB (ref 3.6–11.0)

## 2016-02-13 LAB — FIBRIN DERIVATIVES D-DIMER (ARMC ONLY): FIBRIN DERIVATIVES D-DIMER (ARMC): 445 (ref 0–499)

## 2016-02-13 MED ORDER — AZITHROMYCIN 500 MG PO TABS
500.0000 mg | ORAL_TABLET | Freq: Once | ORAL | Status: AC
  Administered 2016-02-13: 500 mg via ORAL
  Filled 2016-02-13: qty 1

## 2016-02-13 MED ORDER — LIDOCAINE 5 % EX PTCH
1.0000 | MEDICATED_PATCH | CUTANEOUS | Status: DC
  Administered 2016-02-13: 1 via TRANSDERMAL
  Filled 2016-02-13: qty 1

## 2016-02-13 MED ORDER — LIDOCAINE 5 % EX PTCH: 1.0000 | MEDICATED_PATCH | Freq: Two times a day (BID) | CUTANEOUS | 0 refills | Status: DC

## 2016-02-13 MED ORDER — ETODOLAC 200 MG PO CAPS: 200.0000 mg | ORAL_CAPSULE | Freq: Three times a day (TID) | ORAL | 0 refills | Status: DC

## 2016-02-13 MED ORDER — CEFTRIAXONE SODIUM 1 G IJ SOLR: 1.0000 g | Freq: Once | INTRAMUSCULAR | Status: DC

## 2016-02-13 MED ORDER — CEFTRIAXONE SODIUM-DEXTROSE 1-3.74 GM-% IV SOLR
1.0000 g | Freq: Once | INTRAVENOUS | Status: AC
  Administered 2016-02-13: 1 g via INTRAVENOUS
  Filled 2016-02-13: qty 50

## 2016-02-13 MED ORDER — POTASSIUM CHLORIDE CRYS ER 20 MEQ PO TBCR
40.0000 meq | EXTENDED_RELEASE_TABLET | Freq: Once | ORAL | Status: AC
  Administered 2016-02-13: 40 meq via ORAL
  Filled 2016-02-13: qty 2

## 2016-02-13 MED ORDER — ALBUTEROL SULFATE (2.5 MG/3ML) 0.083% IN NEBU
5.0000 mg | INHALATION_SOLUTION | Freq: Once | RESPIRATORY_TRACT | Status: AC
  Administered 2016-02-13: 5 mg via RESPIRATORY_TRACT
  Filled 2016-02-13: qty 6

## 2016-02-13 MED ORDER — KETOROLAC TROMETHAMINE 30 MG/ML IJ SOLN
30.0000 mg | Freq: Once | INTRAMUSCULAR | Status: AC
  Administered 2016-02-13: 30 mg via INTRAVENOUS
  Filled 2016-02-13: qty 1

## 2016-02-13 MED ORDER — OXYCODONE-ACETAMINOPHEN 5-325 MG PO TABS
1.0000 | ORAL_TABLET | Freq: Once | ORAL | Status: AC
Start: 2016-02-13 — End: 2016-02-13
  Administered 2016-02-13: 1 via ORAL
  Filled 2016-02-13: qty 1

## 2016-02-13 MED ORDER — BUTALBITAL-APAP-CAFFEINE 50-325-40 MG PO TABS
2.0000 | ORAL_TABLET | Freq: Once | ORAL | Status: AC
  Administered 2016-02-13: 2 via ORAL
  Filled 2016-02-13: qty 2

## 2016-02-13 MED ORDER — SODIUM CHLORIDE 0.9 % IV BOLUS (SEPSIS)
1000.0000 mL | Freq: Once | INTRAVENOUS | Status: AC
  Administered 2016-02-13: 1000 mL via INTRAVENOUS

## 2016-02-13 MED ORDER — AZITHROMYCIN 250 MG PO TABS: ORAL_TABLET | ORAL | 0 refills | Status: AC

## 2016-02-13 NOTE — ED Triage Notes (Signed)
Pt presents to ED with c/o shortness of breath. Pt appears anxious. Dx with pneumonia early this morning in this ED and prescribed antibotics. Pt states she is "just hurting all over" with worsening chest pain and sob. Pt tachypnic in waiting room but when encouraged to slow her breathing down by staff pt respirations became regular and unlabored after several minutes. No audible wheezing present at this time. Pt states she " just wants to get comfortable" and "just hasn't been able to get comfortable all night".

## 2016-02-13 NOTE — ED Provider Notes (Signed)
[facility] Emergency Department Provider Note   ____________________________________________   First MD Initiated Contact with Patient 02/13/16 [phone]     (approximate)  I have reviewed the triage vital signs and the nursing notes.   HISTORY  Chief Complaint Shortness of Breath    HPI Jessica Wong is a 30 y.o. female who comes into the hospital today feeling sick for the past 2 days. The patient reports that she's had some fever, vomiting. Her right ear hurts and she has some back pain. She never checked her temperature at home. The patient reports that she has a cough and it hurts to breathe. She reports it is worse on the right greater than left. The patient reports that she is not coughing anything up that she still coughing. She hasn't eaten much in 2 days. She has had some posttussive emesis. She feels very short of breath and has a runny nose and a headache. She has mild abdominal pain all over and rates her pain 8 out of 10 in intensity. Her pain in her back is in the mid back. She's had similar pain with an asthma attack before. She reports that her inhaler at home is not helping her symptoms. The patient has been taking Tylenol ibuprofen and Mucinex. She decided to come into the hospital today for evaluation.   Past Medical History:  Diagnosis Date  . Asthma   . Thyroid disease     There are no active problems to display for this patient.   Past Surgical History:  Procedure Laterality Date  . APPENDECTOMY    . CHOLECYSTECTOMY    . DILATION AND CURETTAGE OF UTERUS    . KNEE SURGERY Right     Prior to Admission medications   Medication Sig Start Date End Date Taking? Authorizing Provider  albuterol (PROAIR HFA) 108 (90 Base) MCG/ACT inhaler Inhale 2 puffs into the lungs every 6 (six) hours as needed. For wheezing.    Historical Provider, MD  azithromycin (ZITHROMAX Z-PAK) 250 MG tablet Take 2 tablets (500 mg) on  Day 1,  followed by 1  tablet (250 mg) once daily on Days 2 through 5. 02/13/16 02/18/16  Rebecka Apley, MD  clotrimazole (LOTRIMIN) 1 % cream Applied to areas beneath the breasts twice a day for rash. 11/12/15   Tommi Rumps, PA-C  etodolac (LODINE) 200 MG capsule Take 1 capsule (200 mg total) by mouth every 8 (eight) hours. 02/13/16   Rebecka Apley, MD  hydrOXYzine (ATARAX/VISTARIL) 25 MG tablet Take 1 tablet (25 mg total) by mouth every 6 (six) hours as needed for itching. 11/12/15   Tommi Rumps, PA-C  ibuprofen (ADVIL,MOTRIN) 200 MG tablet Take 800 mg by mouth 2 (two) times daily.    Historical Provider, MD  lidocaine (LIDODERM) 5 % Place 1 patch onto the skin every 12 (twelve) hours. Remove & Discard patch within 12 hours or as directed by MD 02/13/16 02/12/17  Rebecka Apley, MD  LORazepam (ATIVAN) 1 MG tablet Take 1 tablet (1 mg total) by mouth every 8 (eight) hours as needed for anxiety. 09/24/15 09/23/16  Sharyn Creamer, MD  ondansetron (ZOFRAN ODT) 4 MG disintegrating tablet Take 1 tablet (4 mg total) by mouth every 6 (six) hours as needed for nausea or vomiting. 09/24/15   Sharyn Creamer, MD  oxyCODONE (ROXICODONE) 5 MG immediate release tablet Take 1 tablet (5 mg total) by mouth every 4 (four) hours as needed. 11/12/15 11/11/16  Rhonda L  Summers, PA-C  traMADol (ULTRAM) 50 MG tablet Take 1 tablet (50 mg total) by mouth every 6 (six) hours as needed. 08/24/15   Jami L Hagler, PA-C    Allergies Tramadol; Flagyl [metronidazole]; Bactrim [sulfamethoxazole-trimethoprim]; and Tape  No family history on file.  Social History Social History  Substance Use Topics  . Smoking status: Never Smoker  . Smokeless tobacco: Never Used  . Alcohol use No    Review of Systems Constitutional:  fever/chills Eyes: No visual changes. ENT: No sore throat. Cardiovascular:  chest pain. Respiratory:  shortness of breath. Gastrointestinal: No abdominal pain.  No nausea, no vomiting.  No diarrhea.  No  constipation. Genitourinary: Negative for dysuria. Musculoskeletal:  back pain. Skin: Negative for rash. Neurological: Headache  10-point ROS otherwise negative.  ____________________________________________   PHYSICAL EXAM:  VITAL SIGNS: ED Triage Vitals  Enc Vitals Group     BP 02/13/16 0228 125/79     Pulse Rate 02/13/16 0228 95     Resp 02/13/16 0228 20     Temp 02/13/16 0228 98.9 F (37.2 C)     Temp Source 02/13/16 0228 Oral     SpO2 02/13/16 0228 98 %     Weight 02/13/16 0229 200 lb (90.7 kg)     Height 02/13/16 0229 5\' 4"  (1.626 m)     Head Circumference --      Peak Flow --      Pain Score 02/13/16 0229 8     Pain Loc --      Pain Edu? --      Excl. in GC? --     Constitutional: Alert and oriented. Well appearing and in Moderate distress. Eyes: Conjunctivae are normal. PERRL. EOMI. Head: Atraumatic. Nose: No congestion/rhinnorhea. Mouth/Throat: Mucous membranes are moist.  Oropharynx non-erythematous. Cardiovascular: Normal rate, regular rhythm. Grossly normal heart sounds.  Good peripheral circulation. Respiratory: Normal respiratory effort.  No retractions. Crackles in right upper lobe Gastrointestinal: Soft and nontender. No distention. Positive bowel sounds Musculoskeletal: No lower extremity tenderness nor edema.   Neurologic:  Normal speech and language.  Skin:  Skin is warm, dry and intact.  Psychiatric: Mood and affect are normal.   ____________________________________________   LABS (all labs ordered are listed, but only abnormal results are displayed)  Labs Reviewed  CBC - Abnormal; Notable for the following:       Result Value   WBC 19.1 (*)    HCT 34.2 (*)    All other components within normal limits  BASIC METABOLIC PANEL - Abnormal; Notable for the following:    Potassium 2.8 (*)    Glucose, Bld 114 (*)    Calcium 8.6 (*)    All other components within normal limits  URINALYSIS COMPLETEWITH MICROSCOPIC ([facility] ONLY) - Abnormal;  Notable for the following:    Color, Urine STRAW (*)    APPearance CLEAR (*)    Ketones, ur 1+ (*)    Hgb urine dipstick 1+ (*)    Leukocytes, UA TRACE (*)    Bacteria, UA RARE (*)    Squamous Epithelial / LPF 0-5 (*)    All other components within normal limits  CULTURE, BLOOD (ROUTINE X 2)  CULTURE, BLOOD (ROUTINE X 2)  FIBRIN DERIVATIVES D-DIMER ([facility] ONLY)   ____________________________________________  EKG  ED ECG REPORT I, Rebecka ApleyWebster,  Allison P, the attending physician, personally viewed and interpreted this ECG.   Date: 02/13/2016  EKG Time: 237  Rate: 84  Rhythm: normal sinus rhythm  Axis: normal  Intervals:none  ST&T Change: ST depression and flipped t waves II, III, avf, V3, V4, V5, V6 ____________________________________________  RADIOLOGY  Chest x-ray ____________________________________________   PROCEDURES  Procedure(s) performed: None  Procedures  Critical Care performed: No  ____________________________________________   INITIAL IMPRESSION / ASSESSMENT AND PLAN / ED COURSE  Pertinent labs & imaging results that were available during my care of the patient were reviewed by me and considered in my medical decision making (see chart for details).  This is a 30 year old female who comes into the hospital today not feeling well. She does have a pneumonia on x-ray. I ordered some blood work and she also has an elevated white blood cell count. The patient's d-dimer is negative. I will give the patient a dose of ceftriaxone as well as azithromycin. I will give some Fioricet for the headache and some Percocet for her back. I will check the patient's urine as well and she'll be reassessed.  Clinical Course as of Feb 12 821  Tue Feb 13, 2016  0502 Right upper lobe consolidation suspicious for pneumonia. DG Chest 2 View [AW]    Clinical Course User Index [AW] Rebecka ApleyAllison P Webster, MD   The patient is feeling improved at this time. She'll be discharged home  to follow-up with her primary care physician.  ____________________________________________   FINAL CLINICAL IMPRESSION(S) / ED DIAGNOSES  Final diagnoses:  Community acquired pneumonia of right upper lobe of lung (HCC)  SOB (shortness of breath)  Acute back pain, unspecified back location, unspecified back pain laterality      NEW MEDICATIONS STARTED DURING THIS VISIT:  Discharge Medication List as of 02/13/2016  7:34 AM    START taking these medications   Details  azithromycin (ZITHROMAX Z-PAK) 250 MG tablet Take 2 tablets (500 mg) on  Day 1,  followed by 1 tablet (250 mg) once daily on Days 2 through 5., Print    etodolac (LODINE) 200 MG capsule Take 1 capsule (200 mg total) by mouth every 8 (eight) hours., Starting Tue 02/13/2016, Print    lidocaine (LIDODERM) 5 % Place 1 patch onto the skin every 12 (twelve) hours. Remove & Discard patch within 12 hours or as directed by MD, Starting Tue 02/13/2016, Until Wed 02/12/2017, Print         Note:  This document was prepared using Dragon voice recognition software and may include unintentional dictation errors.    Rebecka ApleyAllison P Webster, MD 02/13/16 [phone]

## 2016-02-13 NOTE — ED Notes (Signed)
Waiting on disposition

## 2016-02-13 NOTE — ED Triage Notes (Signed)
Pt ambulatory to triage with multiple complaints, pt reports having shortness of breath, lower back pain, fever, chest pain, and right ear pain x 2 days. Pt has hx of asthma. Pt states did not check her temp, states "I just feel real hot." Pt took ibuprofen 30 min PTA. Bilateral clear lung sounds. Pt reports using inhaler and breathing tx without relief.

## 2016-02-14 ENCOUNTER — Emergency Department
Admission: EM | Admit: 2016-02-14 | Discharge: 2016-02-14 | Disposition: A | Discharge status: Home / Self Care | Payer: Self-pay | Attending: Emergency Medicine | Admitting: Emergency Medicine

## 2016-02-14 ENCOUNTER — Emergency Department: Payer: Self-pay

## 2016-02-14 DIAGNOSIS — J181 Lobar pneumonia, unspecified organism: Secondary | ICD-10-CM

## 2016-02-14 DIAGNOSIS — J189 Pneumonia, unspecified organism: Secondary | ICD-10-CM

## 2016-02-14 MED ORDER — ALBUTEROL SULFATE (2.5 MG/3ML) 0.083% IN NEBU
2.5000 mg | INHALATION_SOLUTION | Freq: Once | RESPIRATORY_TRACT | Status: AC
  Administered 2016-02-14: 2.5 mg via RESPIRATORY_TRACT

## 2016-02-14 MED ORDER — POTASSIUM CHLORIDE 20 MEQ PO PACK
40.0000 meq | PACK | Freq: Two times a day (BID) | ORAL | Status: DC
  Administered 2016-02-14: 40 meq via ORAL
  Filled 2016-02-14: qty 2

## 2016-02-14 MED ORDER — IOPAMIDOL (ISOVUE-370) INJECTION 76%
75.0000 mL | Freq: Once | INTRAVENOUS | Status: AC | PRN
  Administered 2016-02-14: 75 mL via INTRAVENOUS

## 2016-02-14 MED ORDER — ONDANSETRON 4 MG PO TBDP
4.0000 mg | ORAL_TABLET | Freq: Once | ORAL | Status: AC
  Administered 2016-02-14: 4 mg via ORAL
  Filled 2016-02-14: qty 1

## 2016-02-14 MED ORDER — KETOROLAC TROMETHAMINE 30 MG/ML IJ SOLN
30.0000 mg | Freq: Once | INTRAMUSCULAR | Status: AC
  Administered 2016-02-14: 30 mg via INTRAVENOUS
  Filled 2016-02-14: qty 1

## 2016-02-14 MED ORDER — LORAZEPAM 1 MG PO TABS
1.0000 mg | ORAL_TABLET | Freq: Once | ORAL | Status: AC
  Administered 2016-02-14: 1 mg via ORAL
  Filled 2016-02-14: qty 1

## 2016-02-14 MED ORDER — ALBUTEROL SULFATE (2.5 MG/3ML) 0.083% IN NEBU
INHALATION_SOLUTION | RESPIRATORY_TRACT | Status: AC
  Administered 2016-02-14: 2.5 mg via RESPIRATORY_TRACT
  Filled 2016-02-14: qty 3

## 2016-02-14 NOTE — ED Notes (Signed)
Patient transported to CT via stretcher.

## 2016-02-14 NOTE — ED Provider Notes (Signed)
[facility] Emergency Department Provider Note    First MD Initiated Contact with Patient 02/14/16 0023     (approximate)  I have reviewed the triage vital signs and the nursing notes.   HISTORY  Chief Complaint Shortness of Breath    HPI Camille BalJuanita D Dan HumphreysWalker is a 30 y.o. female with below list of chronic medical conditions as well as pneumonia diagnosed this morning presents to the emergency department continued shortness of breath as well as chest discomfort. Patient admits to feeling anxious tachypnea, presentation with a respiratory rate of 30 at this time. Patient states that she return to emergency department because she felt very uncomfortable with considerable chest discomfort   Past Medical History:  Diagnosis Date  . Asthma   . Thyroid disease     There are no active problems to display for this patient.   Past Surgical History:  Procedure Laterality Date  . APPENDECTOMY    . CHOLECYSTECTOMY    . DILATION AND CURETTAGE OF UTERUS    . KNEE SURGERY Right     Prior to Admission medications   Medication Sig Start Date End Date Taking? Authorizing Provider  albuterol (PROAIR HFA) 108 (90 Base) MCG/ACT inhaler Inhale 2 puffs into the lungs every 6 (six) hours as needed. For wheezing.    Historical Provider, MD  azithromycin (ZITHROMAX Z-PAK) 250 MG tablet Take 2 tablets (500 mg) on  Day 1,  followed by 1 tablet (250 mg) once daily on Days 2 through 5. 02/13/16 02/18/16  Rebecka ApleyAllison P Webster, MD  clotrimazole (LOTRIMIN) 1 % cream Applied to areas beneath the breasts twice a day for rash. 11/12/15   Tommi Rumpshonda L Summers, PA-C  etodolac (LODINE) 200 MG capsule Take 1 capsule (200 mg total) by mouth every 8 (eight) hours. 02/13/16   Rebecka ApleyAllison P Webster, MD  hydrOXYzine (ATARAX/VISTARIL) 25 MG tablet Take 1 tablet (25 mg total) by mouth every 6 (six) hours as needed for itching. 11/12/15   Tommi Rumpshonda L Summers, PA-C  ibuprofen (ADVIL,MOTRIN) 200 MG tablet Take 800 mg by  mouth 2 (two) times daily.    Historical Provider, MD  lidocaine (LIDODERM) 5 % Place 1 patch onto the skin every 12 (twelve) hours. Remove & Discard patch within 12 hours or as directed by MD 02/13/16 02/12/17  Rebecka ApleyAllison P Webster, MD  LORazepam (ATIVAN) 1 MG tablet Take 1 tablet (1 mg total) by mouth every 8 (eight) hours as needed for anxiety. 09/24/15 09/23/16  Sharyn CreamerMark Quale, MD  ondansetron (ZOFRAN ODT) 4 MG disintegrating tablet Take 1 tablet (4 mg total) by mouth every 6 (six) hours as needed for nausea or vomiting. 09/24/15   Sharyn CreamerMark Quale, MD  oxyCODONE (ROXICODONE) 5 MG immediate release tablet Take 1 tablet (5 mg total) by mouth every 4 (four) hours as needed. 11/12/15 11/11/16  Tommi Rumpshonda L Summers, PA-C  traMADol (ULTRAM) 50 MG tablet Take 1 tablet (50 mg total) by mouth every 6 (six) hours as needed. 08/24/15   Jami L Hagler, PA-C    Allergies Tramadol; Flagyl [metronidazole]; Bactrim [sulfamethoxazole-trimethoprim]; and Tape  No family history on file.  Social History Social History  Substance Use Topics  . Smoking status: Never Smoker  . Smokeless tobacco: Never Used  . Alcohol use No    Review of Systems Constitutional: No fever/chills Eyes: No visual changes. ENT: No sore throat. Cardiovascular: Positive for chest pain. Respiratory: Denies shortness of breath. Positive for cough Gastrointestinal: No abdominal pain.  No nausea, no vomiting.  No diarrhea.  No constipation. Genitourinary: Negative for dysuria. Musculoskeletal: Negative for back pain. Skin: Negative for rash. Neurological: Negative for headaches, focal weakness or numbness.  10-point ROS otherwise negative.  ____________________________________________   PHYSICAL EXAM:  VITAL SIGNS: ED Triage Vitals [02/13/16 2254]  Enc Vitals Group     BP (!) 142/61     Pulse Rate 96     Resp (!) 22     Temp 97.8 F (36.6 C)     Temp Source Oral     SpO2 100 %     Weight 200 lb (90.7 kg)     Height 5\' 4"  (1.626 m)      Head Circumference      Peak Flow      Pain Score      Pain Loc      Pain Edu?      Excl. in GC?     Constitutional: Alert and oriented. Well appearing and in no acute distress. Eyes: Conjunctivae are normal. PERRL. EOMI. Head: Atraumatic. Ears:  Healthy appearing ear canals and TMs bilaterally Nose: No congestion/rhinnorhea. Mouth/Throat: Mucous membranes are moist.  Oropharynx non-erythematous. Neck: No stridor.  No meningeal signs.   Cardiovascular: Normal rate, regular rhythm. Good peripheral circulation. Grossly normal heart sounds. Respiratory: Tachypnea.  No retractions. Lungs CTAB. Gastrointestinal: Soft and nontender. No distention.  Musculoskeletal: No lower extremity tenderness nor edema. No gross deformities of extremities. Neurologic:  Normal speech and language. No gross focal neurologic deficits are appreciated.  Skin:  Skin is warm, dry and intact. No rash noted. Psychiatric: Very anxious affect. Speech and behavior are normal.   RADIOLOGY I, Ackley N Roma Bondar, personally viewed and evaluated these images (plain radiographs) as part of my medical decision making, as well as reviewing the written report by the radiologist.  Dg Chest 2 View  Result Date: 02/13/2016 CLINICAL DATA:  Right upper lobe pneumonia. Patient is hurting all over with worsening chest pain dyspnea. EXAM: CHEST  2 VIEW COMPARISON:  02/13/2016 at 0302 hours FINDINGS: The heart size and mediastinal contours are within normal limits. Re- demonstration of right upper lobe pneumonic consolidation slightly more confluent on current study. Left lung remains clear. Heart and mediastinal contours are stable and within normal limits. No pneumothorax, CHF nor effusion. No acute osseous abnormality. The visualized skeletal structures are unremarkable. IMPRESSION: Evolving right upper lobe pneumonia. Electronically Signed   By: Tollie Ethavid  Kwon M.D.   On: 02/13/2016 23:52   Dg Chest 2 View  Result Date:  02/13/2016 CLINICAL DATA:  Chest pain and shortness of breath.  Fever. EXAM: CHEST  2 VIEW COMPARISON:  Single-view radiograph 11/23/2015 FINDINGS: Right suprahilar consolidation suspicious for pneumonia. The left lung is clear. The heart size is normal. No pleural fluid or pneumothorax. No pulmonary edema. No acute osseous abnormality. IMPRESSION: Right upper lobe consolidation suspicious for pneumonia. Electronically Signed   By: Rubye OaksMelanie  Ehinger M.D.   On: 02/13/2016 03:15    Procedures   INITIAL IMPRESSION / ASSESSMENT AND PLAN / ED COURSE  Pertinent labs & imaging results that were available during my care of the patient were reviewed by me and considered in my medical decision making (see chart for details).     Clinical Course     ____________________________________________  FINAL CLINICAL IMPRESSION(S) / ED DIAGNOSES  Final diagnoses:  Community acquired pneumonia of right upper lobe of lung (HCC)     MEDICATIONS GIVEN DURING THIS VISIT:  Medications  LORazepam (ATIVAN) tablet 1 mg (not administered)  albuterol (PROVENTIL) (2.5 MG/3ML) 0.083% nebulizer solution (not administered)     NEW OUTPATIENT MEDICATIONS STARTED DURING THIS VISIT:  New Prescriptions   No medications on file    Modified Medications   No medications on file    Discontinued Medications   No medications on file     Note:  This document was prepared using Dragon voice recognition software and may include unintentional dictation errors.    Darci Current, MD 02/14/16 [phone]

## 2016-02-14 NOTE — ED Notes (Signed)
Patient transported to CT 

## 2016-02-18 LAB — CULTURE, BLOOD (ROUTINE X 2)
CULTURE: NO GROWTH
CULTURE: NO GROWTH

## 2016-03-14 ENCOUNTER — Emergency Department: Payer: Self-pay

## 2016-03-14 ENCOUNTER — Encounter: Payer: Self-pay | Admitting: Intensive Care

## 2016-03-14 DIAGNOSIS — J45901 Unspecified asthma with (acute) exacerbation: Secondary | ICD-10-CM | POA: Insufficient documentation

## 2016-03-14 LAB — BASIC METABOLIC PANEL
ANION GAP: 8 (ref 5–15)
BUN: 11 mg/dL (ref 6–20)
CALCIUM: 8.5 mg/dL — AB (ref 8.9–10.3)
CO2: 23 mmol/L (ref 22–32)
Chloride: 109 mmol/L (ref 101–111)
Creatinine, Ser: 0.89 mg/dL (ref 0.44–1.00)
GFR calc Af Amer: >60 mL/min (ref >60)
GLUCOSE: 96 mg/dL (ref 65–99)
POTASSIUM: 3.4 mmol/L — AB (ref 3.5–5.1)
Sodium: 140 mmol/L (ref 135–145)

## 2016-03-14 LAB — CBC
HCT: 34.2 % — ABNORMAL LOW (ref 35.0–47.0)
HEMOGLOBIN: 12.1 g/dL (ref 12.0–16.0)
MCH: 31.3 pg (ref 26.0–34.0)
MCHC: 35.3 g/dL (ref 32.0–36.0)
MCV: 88.6 fL (ref 80.0–100.0)
PLATELETS: 261 10*3/uL (ref 150–440)
RBC: 3.86 MIL/uL (ref 3.80–5.20)
RDW: 13.5 % (ref 11.5–14.5)
WBC: 7.2 10*3/uL (ref 3.6–11.0)

## 2016-03-14 LAB — TROPONIN I

## 2016-03-14 MED ORDER — ALBUTEROL SULFATE (2.5 MG/3ML) 0.083% IN NEBU
INHALATION_SOLUTION | RESPIRATORY_TRACT | Status: AC
  Administered 2016-03-14: 5 mg via RESPIRATORY_TRACT
  Filled 2016-03-14: qty 3

## 2016-03-14 MED ORDER — ALBUTEROL SULFATE (2.5 MG/3ML) 0.083% IN NEBU
5.0000 mg | INHALATION_SOLUTION | Freq: Once | RESPIRATORY_TRACT | Status: AC
  Administered 2016-03-14: 5 mg via RESPIRATORY_TRACT
  Filled 2016-03-14: qty 6

## 2016-03-14 NOTE — ED Triage Notes (Signed)
Patient presents to ER with Asthma Attack. Patient states EMS was called to her work earlier and she refused to be transported to ER. SOB got worse throughout the day. Audible wheezes noted in triage. Given 5mg  albuterol in triage. A&O x4. Non productive cough present. Patient reported that coughing led to her attack

## 2016-03-15 ENCOUNTER — Emergency Department
Admission: EM | Admit: 2016-03-15 | Discharge: 2016-03-15 | Disposition: A | Discharge status: Home / Self Care | Payer: Self-pay | Attending: Emergency Medicine | Admitting: Emergency Medicine

## 2016-03-15 DIAGNOSIS — J45901 Unspecified asthma with (acute) exacerbation: Secondary | ICD-10-CM

## 2016-03-15 MED ORDER — IPRATROPIUM-ALBUTEROL 0.5-2.5 (3) MG/3ML IN SOLN
RESPIRATORY_TRACT | Status: AC
  Administered 2016-03-15: 3 mL via RESPIRATORY_TRACT
  Filled 2016-03-15: qty 3

## 2016-03-15 MED ORDER — HYDROCOD POLST-CPM POLST ER 10-8 MG/5ML PO SUER: 5.0000 mL | Freq: Two times a day (BID) | ORAL | 0 refills | Status: DC | PRN

## 2016-03-15 MED ORDER — IPRATROPIUM-ALBUTEROL 0.5-2.5 (3) MG/3ML IN SOLN
3.0000 mL | Freq: Once | RESPIRATORY_TRACT | Status: AC
  Administered 2016-03-15: 3 mL via RESPIRATORY_TRACT

## 2016-03-15 MED ORDER — ALBUTEROL SULFATE HFA 108 (90 BASE) MCG/ACT IN AERS: 2.0000 | INHALATION_SPRAY | Freq: Four times a day (QID) | RESPIRATORY_TRACT | 2 refills | Status: DC | PRN

## 2016-03-15 MED ORDER — PREDNISONE 20 MG PO TABS: 60.0000 mg | ORAL_TABLET | Freq: Every day | ORAL | 0 refills | Status: AC

## 2016-03-15 MED ORDER — METHYLPREDNISOLONE SODIUM SUCC 125 MG IJ SOLR
125.0000 mg | Freq: Once | INTRAMUSCULAR | Status: AC
  Administered 2016-03-15: 125 mg via INTRAVENOUS
  Filled 2016-03-15: qty 2

## 2016-03-15 NOTE — ED Provider Notes (Signed)
[facility] Emergency Department Provider Note   First MD Initiated Contact with Patient 03/15/16 0032     (approximate)  I have reviewed the triage vital signs and the nursing notes.   HISTORY  Chief Complaint Shortness of Breath  HPI Jessica Wong is a 31 y.o. female with history of asthma presents to the emergency department with "asthma attack. Patient states that her coworkers called EMS secondary to the fact that she was having increasing dyspnea throughout the course of the day with audible wheezes. Patient was given 5 mg of albuterol nebulized treatment on arrival to the emergency department. Patient does admit to a nonproductive cough. Patient states the symptoms all started today.   Past Medical History:  Diagnosis Date  . Asthma   . Thyroid disease     There are no active problems to display for this patient.   Past Surgical History:  Procedure Laterality Date  . APPENDECTOMY    . CHOLECYSTECTOMY    . DILATION AND CURETTAGE OF UTERUS    . KNEE SURGERY Right     Prior to Admission medications   Medication Sig Start Date End Date Taking? Authorizing Provider  albuterol (PROAIR HFA) 108 (90 Base) MCG/ACT inhaler Inhale 2 puffs into the lungs every 6 (six) hours as needed. For wheezing.    Historical Provider, MD  clotrimazole (LOTRIMIN) 1 % cream Applied to areas beneath the breasts twice a day for rash. 11/12/15   Tommi Rumpshonda L Summers, PA-C  etodolac (LODINE) 200 MG capsule Take 1 capsule (200 mg total) by mouth every 8 (eight) hours. 02/13/16   Rebecka ApleyAllison P Webster, MD  hydrOXYzine (ATARAX/VISTARIL) 25 MG tablet Take 1 tablet (25 mg total) by mouth every 6 (six) hours as needed for itching. 11/12/15   Tommi Rumpshonda L Summers, PA-C  ibuprofen (ADVIL,MOTRIN) 200 MG tablet Take 800 mg by mouth 2 (two) times daily.    Historical Provider, MD  lidocaine (LIDODERM) 5 % Place 1 patch onto the skin every 12 (twelve) hours. Remove & Discard patch within 12 hours or  as directed by MD 02/13/16 02/12/17  Rebecka ApleyAllison P Webster, MD  LORazepam (ATIVAN) 1 MG tablet Take 1 tablet (1 mg total) by mouth every 8 (eight) hours as needed for anxiety. 09/24/15 09/23/16  Sharyn CreamerMark Quale, MD  ondansetron (ZOFRAN ODT) 4 MG disintegrating tablet Take 1 tablet (4 mg total) by mouth every 6 (six) hours as needed for nausea or vomiting. 09/24/15   Sharyn CreamerMark Quale, MD  oxyCODONE (ROXICODONE) 5 MG immediate release tablet Take 1 tablet (5 mg total) by mouth every 4 (four) hours as needed. 11/12/15 11/11/16  Tommi Rumpshonda L Summers, PA-C  traMADol (ULTRAM) 50 MG tablet Take 1 tablet (50 mg total) by mouth every 6 (six) hours as needed. 08/24/15   Jami L Hagler, PA-C    Allergies Tramadol; Flagyl [metronidazole]; Bactrim [sulfamethoxazole-trimethoprim]; and Tape  History reviewed. No pertinent family history.  Social History Social History  Substance Use Topics  . Smoking status: Never Smoker  . Smokeless tobacco: Never Used  . Alcohol use No    Review of Systems Constitutional: No fever/chills Eyes: No visual changes. ENT: No sore throat. Cardiovascular: Denies chest pain. Respiratory: Positive for wheezing coughing Gastrointestinal: No abdominal pain.  No nausea, no vomiting.  No diarrhea.  No constipation. Genitourinary: Negative for dysuria. Musculoskeletal: Negative for back pain. Skin: Negative for rash. Neurological: Negative for headaches, focal weakness or numbness.  10-point ROS otherwise negative.  ____________________________________________   PHYSICAL EXAM:  VITAL SIGNS: ED Triage Vitals  Enc Vitals Group     BP 03/14/16 1827 130/68     Pulse Rate 03/14/16 1827 62     Resp 03/14/16 1827 (!) 24     Temp 03/14/16 1827 97.8 F (36.6 C)     Temp Source 03/14/16 1827 Oral     SpO2 03/14/16 1827 100 %     Weight 03/14/16 1828 200 lb (90.7 kg)     Height 03/14/16 1828 5\' 4"  (1.626 m)     Head Circumference --      Peak Flow --      Pain Score 03/14/16 1836 7     Pain Loc  --      Pain Edu? --      Excl. in GC? --     Constitutional: Alert and oriented. Apparent respiratory distress Eyes: Conjunctivae are normal. PERRL. EOMI. Head: Atraumatic. Mouth/Throat: Mucous membranes are moist.  Oropharynx non-erythematous. Neck: No stridor.   Cardiovascular: Normal rate, regular rhythm. Good peripheral circulation. Grossly normal heart sounds. Respiratory: Normal respiratory effort.  No retractions. Diffuse expiratory wheezes  Gastrointestinal: Soft and nontender. No distention.  Musculoskeletal: No lower extremity tenderness nor edema. No gross deformities of extremities. Neurologic:  Normal speech and language. No gross focal neurologic deficits are appreciated.  Skin:  Skin is warm, dry and intact. No rash noted. Psychiatric: Mood and affect are normal. Speech and behavior are normal  ____________________________________________   LABS (all labs ordered are listed, but only abnormal results are displayed)  Labs Reviewed  BASIC METABOLIC PANEL - Abnormal; Notable for the following:       Result Value   Potassium 3.4 (*)    Calcium 8.5 (*)    All other components within normal limits  CBC - Abnormal; Notable for the following:    HCT 34.2 (*)    All other components within normal limits  TROPONIN I   ____________________________________________  EKG  ED ECG REPORT I, Colstrip N Nishat Livingston, the attending physician, personally viewed and interpreted this ECG.   Date: 03/15/2016  EKG Time: 6:41 PM  Rate: 74  Rhythm: Normal sinus rhythm  Axis: Normal  Intervals: Normal  ST&T Change: None  ____________________________________________  RADIOLOGY I, Tice N Britanni Yarde, personally viewed and evaluated these images (plain radiographs) as part of my medical decision making, as well as reviewing the written report by the radiologist.  Dg Chest 2 View  Result Date: 03/14/2016 CLINICAL DATA:  Initial evaluation for acute shortness of breath. EXAM: CHEST  2  VIEW COMPARISON:  Prior radiograph from 02/13/2016. FINDINGS: Cardiac and mediastinal silhouettes are stable in size and contour, and remain within normal limits. Lungs normally inflated. No focal infiltrates. No pulmonary edema or pleural effusion. No pneumothorax. Mild scarring noted within the right upper lobe. No acute osseous abnormality. IMPRESSION: 1. No active cardiopulmonary disease. 2. Mild atelectasis/scarring within the right upper lobe. Electronically Signed   By: Rise Mu M.D.   On: 03/14/2016 19:07      Procedures    INITIAL IMPRESSION / ASSESSMENT AND PLAN / ED COURSE  Pertinent labs & imaging results that were available during my care of the patient were reviewed by me and considered in my medical decision making (see chart for details).  Patient received an additional DuoNeb in the emergency department as well as Cyclogyl 125 mg. On repeat exam patient lungs clear to auscultation the patient states that she feels much better and agrees to discharge at this time. Patient  will be discharged home with prednisone and albuterol   Clinical Course     ____________________________________________  FINAL CLINICAL IMPRESSION(S) / ED DIAGNOSES  Final diagnoses:  Moderate asthma with exacerbation, unspecified whether persistent     MEDICATIONS GIVEN DURING THIS VISIT:  Medications  albuterol (PROVENTIL) (2.5 MG/3ML) 0.083% nebulizer solution 5 mg (5 mg Nebulization Given 03/14/16 1833)  ipratropium-albuterol (DUONEB) 0.5-2.5 (3) MG/3ML nebulizer solution 3 mL (3 mLs Nebulization Given 03/15/16 0042)  methylPREDNISolone sodium succinate (SOLU-MEDROL) 125 mg/2 mL injection 125 mg (125 mg Intravenous Given 03/15/16 0043)     NEW OUTPATIENT MEDICATIONS STARTED DURING THIS VISIT:  New Prescriptions   No medications on file    Modified Medications   No medications on file    Discontinued Medications   No medications on file     Note:  This document was  prepared using Dragon voice recognition software and may include unintentional dictation errors.     Darci Current, MD 03/15/16 [phone]

## 2016-04-12 ENCOUNTER — Encounter: Payer: Self-pay | Admitting: Medical Oncology

## 2016-04-12 ENCOUNTER — Emergency Department
Admission: EM | Admit: 2016-04-12 | Discharge: 2016-04-12 | Disposition: A | Discharge status: Home / Self Care | Payer: Medicaid Other | Attending: Emergency Medicine | Admitting: Emergency Medicine

## 2016-04-12 ENCOUNTER — Emergency Department: Payer: Medicaid Other

## 2016-04-12 DIAGNOSIS — N83202 Unspecified ovarian cyst, left side: Secondary | ICD-10-CM | POA: Diagnosis not present

## 2016-04-12 DIAGNOSIS — Z3A01 Less than 8 weeks gestation of pregnancy: Secondary | ICD-10-CM | POA: Diagnosis not present

## 2016-04-12 DIAGNOSIS — R102 Pelvic and perineal pain: Secondary | ICD-10-CM | POA: Insufficient documentation

## 2016-04-12 DIAGNOSIS — O26891 Other specified pregnancy related conditions, first trimester: Secondary | ICD-10-CM | POA: Diagnosis not present

## 2016-04-12 DIAGNOSIS — J45909 Unspecified asthma, uncomplicated: Secondary | ICD-10-CM | POA: Insufficient documentation

## 2016-04-12 LAB — CBC
HCT: 36.3 % (ref 35.0–47.0)
Hemoglobin: 12.4 g/dL (ref 12.0–16.0)
MCH: 30.3 pg (ref 26.0–34.0)
MCHC: 34.1 g/dL (ref 32.0–36.0)
MCV: 89 fL (ref 80.0–100.0)
PLATELETS: 409 10*3/uL (ref 150–440)
RBC: 4.08 MIL/uL (ref 3.80–5.20)
RDW: 13.3 % (ref 11.5–14.5)
WBC: 7.3 10*3/uL (ref 3.6–11.0)

## 2016-04-12 LAB — COMPREHENSIVE METABOLIC PANEL
ALBUMIN: 4.1 g/dL (ref 3.5–5.0)
ALK PHOS: 90 U/L (ref 38–126)
ALT: 46 U/L (ref 14–54)
AST: 34 U/L (ref 15–41)
Anion gap: 5 (ref 5–15)
BILIRUBIN TOTAL: 0.4 mg/dL (ref 0.3–1.2)
BUN: 9 mg/dL (ref 6–20)
CALCIUM: 9 mg/dL (ref 8.9–10.3)
CO2: 26 mmol/L (ref 22–32)
CREATININE: 0.75 mg/dL (ref 0.44–1.00)
Chloride: 105 mmol/L (ref 101–111)
GFR calc Af Amer: >60 mL/min (ref >60)
GFR calc non Af Amer: >60 mL/min (ref >60)
GLUCOSE: 143 mg/dL — AB (ref 65–99)
Potassium: 3.8 mmol/L (ref 3.5–5.1)
SODIUM: 136 mmol/L (ref 135–145)
TOTAL PROTEIN: 7.2 g/dL (ref 6.5–8.1)

## 2016-04-12 LAB — URINALYSIS, COMPLETE (UACMP) WITH MICROSCOPIC
Bacteria, UA: NONE SEEN
Bilirubin Urine: NEGATIVE
GLUCOSE, UA: NEGATIVE mg/dL
HGB URINE DIPSTICK: NEGATIVE
Ketones, ur: NEGATIVE mg/dL
Leukocytes, UA: NEGATIVE
NITRITE: NEGATIVE
PH: 5 (ref 5.0–8.0)
Protein, ur: NEGATIVE mg/dL
SPECIFIC GRAVITY, URINE: 1.01 (ref 1.005–1.030)

## 2016-04-12 LAB — LIPASE, BLOOD: Lipase: 31 U/L (ref 11–51)

## 2016-04-12 LAB — POCT PREGNANCY, URINE: Preg Test, Ur: POSITIVE — AB

## 2016-04-12 LAB — ABO/RH: ABO/RH(D): O POS

## 2016-04-12 LAB — HCG, QUANTITATIVE, PREGNANCY: HCG, BETA CHAIN, QUANT, S: 1372 m[IU]/mL — AB (ref <5)

## 2016-04-12 MED ORDER — OXYCODONE-ACETAMINOPHEN 5-325 MG PO TABS: 1.0000 | ORAL_TABLET | Freq: Four times a day (QID) | ORAL | 0 refills | Status: DC | PRN

## 2016-04-12 MED ORDER — FENTANYL CITRATE (PF) 100 MCG/2ML IJ SOLN
50.0000 ug | Freq: Once | INTRAMUSCULAR | Status: AC
  Administered 2016-04-12: 50 ug via INTRAVENOUS
  Filled 2016-04-12: qty 2

## 2016-04-12 NOTE — Consult Note (Signed)
[place] Ob Comp Less 14 Wks  Result Date: 04/12/2016 CLINICAL DATA:  Pelvic pain in pregnancy EXAM: OBSTETRIC <14 WK US AND TRANSVAGINAL OB US TECHNIQUE: Both transabdominal and transvaginal ultrasound examinations were performed for complete evaluation of the gestation as well as the maternal uterus, adnexal regions, and pelvic cul-de-sac. Transvaginal technique was performed to assess early pregnancy. COMPARISON:  None. FINDINGS: Intrauterine gestational sac: Single Yolk sac:  Not visualized Embryo:  Not visualized Cardiac Activity: Not visualized Heart Rate:   bpm MSD: 3.3  mm   5 w   0  d CRL:    mm    w    d                  US EDC: Subchorionic hemorrhage:  None visualized. Maternal uterus/adnexae: Complex cyst in the left ovary measures 5.7 x 3.8 x 3.8 cm. Low level echoes and reticular pattern noted, most compatible with hemorrhagic cyst. Small to moderate free fluid in the pelvis. IMPRESSION: Early intrauterine gestation. No yolk sac or fetal pole currently. Estimated gestational age [redacted] weeks by mean sac diameter. 5.7 cm complex hemorrhagic cyst within the left ovary. Electronically Signed   By: Charlett NoseKevin  Dover M.D.   On: 04/12/2016 10:26   [place] Ob Transvaginal  Result Date: 04/12/2016 CLINICAL DATA:  Pelvic pain in pregnancy EXAM: OBSTETRIC <14 WK US AND TRANSVAGINAL OB US TECHNIQUE: Both transabdominal and transvaginal ultrasound examinations were performed for complete evaluation of the gestation as well as the maternal uterus, adnexal regions, and pelvic cul-de-sac. Transvaginal technique was performed to assess early pregnancy. COMPARISON:  None. FINDINGS: Intrauterine gestational sac: Single Yolk sac:  Not visualized Embryo:  Not visualized Cardiac Activity: Not visualized Heart Rate:   bpm MSD: 3.3  mm   5 w   0  d CRL:    mm    w    d                  US EDC: Subchorionic hemorrhage:  None visualized. Maternal uterus/adnexae: Complex cyst in the left ovary measures 5.7 x 3.8 x 3.8 cm. Low level echoes and  reticular pattern noted, most compatible with hemorrhagic cyst. Small to moderate free fluid in the pelvis. IMPRESSION: Early intrauterine gestation. No yolk sac or fetal pole currently. Estimated gestational age [redacted] weeks by mean sac diameter. 5.7 cm complex hemorrhagic cyst within the left ovary. Electronically Signed   By: Charlett NoseKevin  Dover M.D.   On: 04/12/2016 10:26   Case and imaging reviewed.  LMP sometime around January 3rd, 2018, coming in with abdominal pain.  No vaginal bleeding.  Imaging appears to show early intrauterine gestational sac, with apparent surrounding decidual reaction and thickening of the endometrium.  The cyst most likely represents a hemorrhagic corpus luteum cyst.  Non-surgical abdomen on exam, hemodynamically stable, and unplanned but desired pregnancy.  Will have patient return in 48-hrs for repeat HCG measurement.  I did discuss that ectopic pregnancy remains in the differential, and provided her with precautions should she develop vaginal bleeding or worsening abdominal pain.

## 2016-04-12 NOTE — Discharge Instructions (Signed)
If you have any increased pain, vaginal bleeding, or you feel faint, or you have any other new or worrisome symptoms return immediately to the emergency department. Follow closely on Sunday for a repeat check of your  Quant using the prescription given to you by OB/GYN.

## 2016-04-12 NOTE — ED Notes (Signed)
OB at bedside explaining US and pain in patient

## 2016-04-12 NOTE — ED Notes (Signed)
Patient transported to Ultrasound 

## 2016-04-12 NOTE — ED Provider Notes (Addendum)
[facility] Emergency Department Provider Note  ____________________________________________   I have reviewed the triage vital signs and the nursing notes.   HISTORY  Chief Complaint Abdominal Pain    HPI Jessica Wong is a 31 y.o. female G2 P2 no significant gynecologic history does have a history of ovarian cyst resents with left lower quadrant the last 2 days. Her last menstrual period was in December on the third. She had a slight spotting in January 3 but nothing that she would consider real period. She denies any nausea vomiting diarrhea or any other associated symptoms. Rates the discomfort in her left lower quadrant has been getting gradually worse over the last day or 2. She denies any vaginal discharge or vaginal bleeding or trauma. No fever no dysuria.      Past Medical History:  Diagnosis Date  . Asthma   . Thyroid disease     There are no active problems to display for this patient.   Past Surgical History:  Procedure Laterality Date  . APPENDECTOMY    . CHOLECYSTECTOMY    . DILATION AND CURETTAGE OF UTERUS    . KNEE SURGERY Right     Prior to Admission medications   Medication Sig Start Date End Date Taking? Authorizing Provider  albuterol (PROVENTIL HFA;VENTOLIN HFA) 108 (90 Base) MCG/ACT inhaler Inhale 2 puffs into the lungs every 6 (six) hours as needed for wheezing or shortness of breath. 03/15/16   Darci Current, MD  chlorpheniramine-HYDROcodone [facility] PENNKINETIC ER) 10-8 MG/5ML SUER Take 5 mLs by mouth every 12 (twelve) hours as needed for cough. Patient not taking: Reported on 04/12/2016 03/15/16   Darci Current, MD  clotrimazole (LOTRIMIN) 1 % cream Applied to areas beneath the breasts twice a day for rash. Patient not taking: Reported on 04/12/2016 11/12/15   Tommi Rumps, PA-C  etodolac (LODINE) 200 MG capsule Take 1 capsule (200 mg total) by mouth every 8 (eight) hours. Patient not taking: Reported on 04/12/2016  02/13/16   Rebecka Apley, MD  hydrOXYzine (ATARAX/VISTARIL) 25 MG tablet Take 1 tablet (25 mg total) by mouth every 6 (six) hours as needed for itching. Patient not taking: Reported on 04/12/2016 11/12/15   Tommi Rumps, PA-C  ibuprofen (ADVIL,MOTRIN) 200 MG tablet Take 800 mg by mouth 2 (two) times daily.    Historical Provider, MD  lidocaine (LIDODERM) 5 % Place 1 patch onto the skin every 12 (twelve) hours. Remove & Discard patch within 12 hours or as directed by MD Patient not taking: Reported on 04/12/2016 02/13/16 02/12/17  Rebecka Apley, MD  LORazepam (ATIVAN) 1 MG tablet Take 1 tablet (1 mg total) by mouth every 8 (eight) hours as needed for anxiety. 09/24/15 09/23/16  Sharyn Creamer, MD  ondansetron (ZOFRAN ODT) 4 MG disintegrating tablet Take 1 tablet (4 mg total) by mouth every 6 (six) hours as needed for nausea or vomiting. Patient not taking: Reported on 04/12/2016 09/24/15   Sharyn Creamer, MD  oxyCODONE (ROXICODONE) 5 MG immediate release tablet Take 1 tablet (5 mg total) by mouth every 4 (four) hours as needed. Patient not taking: Reported on 04/12/2016 11/12/15 11/11/16  Tommi Rumps, PA-C  traMADol (ULTRAM) 50 MG tablet Take 1 tablet (50 mg total) by mouth every 6 (six) hours as needed. Patient not taking: Reported on 04/12/2016 08/24/15   Jami L Hagler, PA-C    Allergies Tramadol; Flagyl [metronidazole]; Bactrim [sulfamethoxazole-trimethoprim]; and Tape  No family history on file.  Social  History Social History  Substance Use Topics  . Smoking status: Never Smoker  . Smokeless tobacco: Never Used  . Alcohol use No    Review of Systems Constitutional: No fever/chills Eyes: No visual changes. ENT: No sore throat. No stiff neck no neck pain Cardiovascular: Denies chest pain. Respiratory: Denies shortness of breath. Gastrointestinal:   no vomiting.  No diarrhea.  No constipation. Genitourinary: Negative for dysuria. Musculoskeletal: Negative lower extremity swelling Skin:  Negative for rash. Neurological: Negative for severe headaches, focal weakness or numbness. 10-point ROS otherwise negative.  ____________________________________________   PHYSICAL EXAM:  VITAL SIGNS: ED Triage Vitals [04/12/16 0735]  Enc Vitals Group     BP 130/78     Pulse Rate 79     Resp 16     Temp 97.8 F (36.6 C)     Temp Source Oral     SpO2 100 %     Weight 200 lb (90.7 kg)     Height 5\' 4"  (1.626 m)     Head Circumference      Peak Flow      Pain Score 7     Pain Loc      Pain Edu?      Excl. in GC?     Constitutional: Alert and oriented. Well appearing and in no acute distress. Eyes: Conjunctivae are normal. PERRL. EOMI. Head: Atraumatic. Nose: No congestion/rhinnorhea. Mouth/Throat: Mucous membranes are moist.  Oropharynx non-erythematous. Neck: No stridor.   Nontender with no meningismus Cardiovascular: Normal rate, regular rhythm. Grossly normal heart sounds.  Good peripheral circulation. Respiratory: Normal respiratory effort.  No retractions. Lungs CTAB. Abdominal: Soft slight tenderness to palpation noted in the left pelvic region,. No distention. No guarding no rebound clinically, no evidence of surgical abdomen or hemoperitoneum Back:  There is no focal tenderness or step off.  there is no midline tenderness there are no lesions noted. there is no CVA tenderness GU, deferred pending ultrasound at this time Musculoskeletal: No lower extremity tenderness, no upper extremity tenderness. No joint effusions, no DVT signs strong distal pulses no edema Neurologic:  Normal speech and language. No gross focal neurologic deficits are appreciated.  Skin:  Skin is warm, dry and intact. No rash noted. Psychiatric: Mood and affect are normal. Speech and behavior are normal.  ____________________________________________   LABS (all labs ordered are listed, but only abnormal results are displayed)  Labs Reviewed  COMPREHENSIVE METABOLIC PANEL - Abnormal; Notable  for the following:       Result Value   Glucose, Bld 143 (*)    All other components within normal limits  URINALYSIS, COMPLETE (UACMP) WITH MICROSCOPIC - Abnormal; Notable for the following:    Color, Urine STRAW (*)    APPearance CLEAR (*)    Squamous Epithelial / LPF 0-5 (*)    All other components within normal limits  LIPASE, BLOOD  CBC  HCG, QUANTITATIVE, PREGNANCY  POC URINE PREG, ED  ABO/RH   ____________________________________________  EKG  I personally interpreted any EKGs ordered by me or triage  ____________________________________________  RADIOLOGY  I reviewed any imaging ordered by me or triage that were performed during my shift and, if possible, patient and/or family made aware of any abnormal findings. ____________________________________________   PROCEDURES  Procedure(s) performed: None  Procedures  Critical Care performed: None  ____________________________________________   INITIAL IMPRESSION / ASSESSMENT AND PLAN / ED COURSE  Pertinent labs & imaging results that were available during my care of the patient were reviewed by  me and considered in my medical decision making (see chart for details).  Patient with lower abdominal discomfort in the context of likely early pregnancy. We will sent for ultrasound and reassess.  ----------------------------------------- 10:45 AM on 04/12/2016 -----------------------------------------  Pt pain-free after fentanyl. Discussed with Dr. Chauncey CruelStabler the results of her ultrasound and blood work etc. He will come evaluate the patient. We'll defer pelvic exam to him at this time.    ----------------------------------------- 11:43 AM on 04/12/2016 -----------------------------------------  Patient states that she is not allergic to Percocet has had it many times. She states that she is only allergic to tramadol. I did counsel her on what to look out for in terms of allergic reaction. She had no reaction to  fentanyl here. She is not allergic to all narcotics.  ____________________________________________   FINAL CLINICAL IMPRESSION(S) / ED DIAGNOSES  Final diagnoses:  None      This chart was dictated using voice recognition software.  Despite best efforts to proofread,  errors can occur which can change meaning.      Jeanmarie PlantJames A McShane, MD 04/12/16 0900    Jeanmarie PlantJames A McShane, MD 04/12/16 1045    Jeanmarie PlantJames A McShane, MD 04/12/16 [phone]

## 2016-04-12 NOTE — ED Triage Notes (Signed)
Pt reports she began having left lower abd pain yesterday, has been having intermittent nausea and vomiting, took a preg test at home 2 weeks ago and it was positive. Pt denies vaginal bleeding. Denies fever.

## 2016-04-14 ENCOUNTER — Other Ambulatory Visit
Admission: AD | Admit: 2016-04-14 | Discharge: 2016-04-14 | Disposition: A | Discharge status: Home / Self Care | Payer: Medicaid Other | Source: Ambulatory Visit | Attending: Obstetrics and Gynecology | Admitting: Obstetrics and Gynecology

## 2016-04-14 DIAGNOSIS — O26899 Other specified pregnancy related conditions, unspecified trimester: Secondary | ICD-10-CM | POA: Diagnosis present

## 2016-04-14 DIAGNOSIS — Z3A00 Weeks of gestation of pregnancy not specified: Secondary | ICD-10-CM | POA: Insufficient documentation

## 2016-04-14 LAB — HCG, QUANTITATIVE, PREGNANCY: hCG, Beta Chain, Quant, S: 3916 m[IU]/mL — ABNORMAL HIGH (ref <5)

## 2016-04-24 ENCOUNTER — Other Ambulatory Visit: Payer: Self-pay | Admitting: Obstetrics and Gynecology

## 2016-04-24 DIAGNOSIS — N83202 Unspecified ovarian cyst, left side: Secondary | ICD-10-CM

## 2016-04-24 DIAGNOSIS — N83201 Unspecified ovarian cyst, right side: Secondary | ICD-10-CM

## 2016-04-24 DIAGNOSIS — Z3687 Encounter for antenatal screening for uncertain dates: Secondary | ICD-10-CM

## 2016-04-30 ENCOUNTER — Emergency Department
Admission: EM | Admit: 2016-04-30 | Discharge: 2016-04-30 | Disposition: A | Discharge status: Home / Self Care | Payer: Medicaid Other | Attending: Emergency Medicine | Admitting: Emergency Medicine

## 2016-04-30 DIAGNOSIS — O99511 Diseases of the respiratory system complicating pregnancy, first trimester: Secondary | ICD-10-CM | POA: Diagnosis present

## 2016-04-30 DIAGNOSIS — Z79899 Other long term (current) drug therapy: Secondary | ICD-10-CM | POA: Diagnosis not present

## 2016-04-30 DIAGNOSIS — Z3A08 8 weeks gestation of pregnancy: Secondary | ICD-10-CM | POA: Diagnosis not present

## 2016-04-30 DIAGNOSIS — J069 Acute upper respiratory infection, unspecified: Secondary | ICD-10-CM | POA: Insufficient documentation

## 2016-04-30 DIAGNOSIS — B9789 Other viral agents as the cause of diseases classified elsewhere: Secondary | ICD-10-CM

## 2016-04-30 DIAGNOSIS — J45909 Unspecified asthma, uncomplicated: Secondary | ICD-10-CM | POA: Insufficient documentation

## 2016-04-30 MED ORDER — PSEUDOEPH-BROMPHEN-DM 30-2-10 MG/5ML PO SYRP: 5.0000 mL | ORAL_SOLUTION | Freq: Four times a day (QID) | ORAL | 0 refills | Status: DC | PRN

## 2016-04-30 NOTE — ED Triage Notes (Signed)
Pt c/o cough with congestion for the past week.. Pt is in NAD, able to speak in complete sentences.

## 2016-04-30 NOTE — ED Notes (Signed)
See triage note   States she has had non prod cough with some fever for about 1 week   Min relief with use of inhalers. Afebrile on arrival

## 2016-04-30 NOTE — ED Provider Notes (Signed)
[facility] Emergency Department Provider Note   ____________________________________________   None    (approximate)  I have reviewed the triage vital signs and the nursing notes.   HISTORY  Chief Complaint URI    HPI Jessica Wong is a 31 y.o. G51P2A0 female who presents with a 1 week history of cough. The cough is dry and non-productive. She has associated subjective fever, chills, myalgias, sore throat, and SOB. She denied any CP, nausea, or emesis. She has not tried anything to treat the cough at home. She did not get her influenza vaccination this year and she did note sick contacts at home. She does have a history of asthma. She is [redacted] weeks pregnant currently but denied problems with this current pregnancy.    Past Medical History:  Diagnosis Date  . Asthma   . Thyroid disease     There are no active problems to display for this patient.   Past Surgical History:  Procedure Laterality Date  . APPENDECTOMY    . CHOLECYSTECTOMY    . DILATION AND CURETTAGE OF UTERUS    . KNEE SURGERY Right     Prior to Admission medications   Medication Sig Start Date End Date Taking? Authorizing Provider  albuterol (PROVENTIL HFA;VENTOLIN HFA) 108 (90 Base) MCG/ACT inhaler Inhale 2 puffs into the lungs every 6 (six) hours as needed for wheezing or shortness of breath. 03/15/16   Darci Current, MD  brompheniramine-pseudoephedrine-DM 30-2-10 MG/5ML syrup Take 5 mLs by mouth 4 (four) times daily as needed. 04/30/16   Joni Reining, PA-C  chlorpheniramine-HYDROcodone [facility]) 10-8 MG/5ML SUER Take 5 mLs by mouth every 12 (twelve) hours as needed for cough. Patient not taking: Reported on 04/12/2016 03/15/16   Darci Current, MD  clotrimazole (LOTRIMIN) 1 % cream Applied to areas beneath the breasts twice a day for rash. Patient not taking: Reported on 04/12/2016 11/12/15   Tommi Rumps, PA-C  etodolac (LODINE) 200 MG capsule Take 1 capsule  (200 mg total) by mouth every 8 (eight) hours. Patient not taking: Reported on 04/12/2016 02/13/16   Rebecka Apley, MD  hydrOXYzine (ATARAX/VISTARIL) 25 MG tablet Take 1 tablet (25 mg total) by mouth every 6 (six) hours as needed for itching. Patient not taking: Reported on 04/12/2016 11/12/15   Tommi Rumps, PA-C  ibuprofen (ADVIL,MOTRIN) 200 MG tablet Take 800 mg by mouth 2 (two) times daily.    Historical Provider, MD  lidocaine (LIDODERM) 5 % Place 1 patch onto the skin every 12 (twelve) hours. Remove & Discard patch within 12 hours or as directed by MD Patient not taking: Reported on 04/12/2016 02/13/16 02/12/17  Rebecka Apley, MD  LORazepam (ATIVAN) 1 MG tablet Take 1 tablet (1 mg total) by mouth every 8 (eight) hours as needed for anxiety. 09/24/15 09/23/16  Sharyn Creamer, MD  ondansetron (ZOFRAN ODT) 4 MG disintegrating tablet Take 1 tablet (4 mg total) by mouth every 6 (six) hours as needed for nausea or vomiting. Patient not taking: Reported on 04/12/2016 09/24/15   Sharyn Creamer, MD  oxyCODONE (ROXICODONE) 5 MG immediate release tablet Take 1 tablet (5 mg total) by mouth every 4 (four) hours as needed. Patient not taking: Reported on 04/12/2016 11/12/15 11/11/16  Tommi Rumps, PA-C  oxyCODONE-acetaminophen (ROXICET) 5-325 MG tablet Take 1 tablet by mouth every 6 (six) hours as needed. 04/12/16 04/12/17  Jeanmarie Plant, MD  traMADol (ULTRAM) 50 MG tablet Take 1 tablet (50  mg total) by mouth every 6 (six) hours as needed. Patient not taking: Reported on 04/12/2016 08/24/15   Jami L Hagler, PA-C    Allergies Tramadol; Flagyl [metronidazole]; Bactrim [sulfamethoxazole-trimethoprim]; and Tape  No family history on file.  Social History Social History  Substance Use Topics  . Smoking status: Never Smoker  . Smokeless tobacco: Never Used  . Alcohol use No    Review of Systems Constitutional: Positive for fever/chills Eyes: No visual changes. ENT: Positive for sore throat. Cardiovascular: Denies  chest pain. Respiratory: Positive shortness of breath. Gastrointestinal: No abdominal pain.  No nausea, no vomiting.  No diarrhea.  No constipation. Genitourinary: Negative for dysuria. Musculoskeletal: Negative for back pain. Skin: Negative for rash. Neurological: Negative for headaches, focal weakness or numbness. .  ____________________________________________   PHYSICAL EXAM:  VITAL SIGNS: ED Triage Vitals [04/30/16 0743]  Enc Vitals Group     BP 120/88     Pulse Rate 69     Resp 20     Temp 97.9 F (36.6 C)     Temp Source Oral     SpO2 100 %     Weight 205 lb (93 kg)     Height 5\' 4"  (1.626 m)     Head Circumference      Peak Flow      Pain Score      Pain Loc      Pain Edu?      Excl. in GC?     Constitutional: Alert and oriented. Well appearing and in no acute distress. Eyes: Conjunctivae are normal. PERRL. EOMI. Head: Atraumatic. Nose: No congestion/rhinnorhea. Mouth/Throat: Mucous membranes are moist.  Oropharynx non-erythematous. Neck: No stridor.  Cardiovascular: Normal rate, regular rhythm. Grossly normal heart sounds.  Good peripheral circulation. Respiratory: Normal respiratory effort.  No retractions. Lungs CTAB. Gastrointestinal: Soft and nontender. No distention. No abdominal bruits. No CVA tenderness. Musculoskeletal: No lower extremity tenderness nor edema.  No joint effusions. Neurologic:  Normal speech and language. No gross focal neurologic deficits are appreciated. No gait instability. Skin:  Skin is warm, dry and intact. No rash noted. Psychiatric: Mood and affect are normal. Speech and behavior are normal.  ____________________________________________   LABS (all labs ordered are listed, but only abnormal results are displayed)  Labs Reviewed - No data to  display ____________________________________________  EKG   ____________________________________________  RADIOLOGY   ____________________________________________   PROCEDURES  Procedure(s) performed: None  Procedures  Critical Care performed: No  ____________________________________________   INITIAL IMPRESSION / ASSESSMENT AND PLAN / ED COURSE  Pertinent labs & imaging results that were available during my care of the patient were reviewed by me and considered in my medical decision making (see chart for details).  This is most likely a viral URI with bronchospasm given her symptoms and history of asthma. She will be given Bromphed DM to help provide relief from her cough.      ____________________________________________   FINAL CLINICAL IMPRESSION(S) / ED DIAGNOSES  Final diagnoses:  Viral URI with cough      NEW MEDICATIONS STARTED DURING THIS VISIT:  New Prescriptions   BROMPHENIRAMINE-PSEUDOEPHEDRINE-DM 30-2-10 MG/5ML SYRUP    Take 5 mLs by mouth 4 (four) times daily as needed.     Note:  This document was prepared using Dragon voice recognition software and may include unintentional dictation errors.    Joni Reiningonald K Danika Kluender, PA-C 04/30/16 [id number]    Phineas SemenGraydon Goodman, MD 04/30/16 [phone]

## 2016-05-29 ENCOUNTER — Ambulatory Visit: Payer: Medicaid Other

## 2016-06-08 ENCOUNTER — Encounter: Payer: Self-pay | Admitting: Emergency Medicine

## 2016-06-08 DIAGNOSIS — O9989 Other specified diseases and conditions complicating pregnancy, childbirth and the puerperium: Secondary | ICD-10-CM | POA: Insufficient documentation

## 2016-06-08 DIAGNOSIS — Z5321 Procedure and treatment not carried out due to patient leaving prior to being seen by health care provider: Secondary | ICD-10-CM | POA: Insufficient documentation

## 2016-06-08 DIAGNOSIS — Z3A12 12 weeks gestation of pregnancy: Secondary | ICD-10-CM | POA: Diagnosis not present

## 2016-06-08 DIAGNOSIS — R55 Syncope and collapse: Secondary | ICD-10-CM | POA: Diagnosis not present

## 2016-06-08 LAB — CBC
HCT: 33.7 % — ABNORMAL LOW (ref 35.0–47.0)
Hemoglobin: 11.9 g/dL — ABNORMAL LOW (ref 12.0–16.0)
MCH: 30.9 pg (ref 26.0–34.0)
MCHC: 35.3 g/dL (ref 32.0–36.0)
MCV: 87.5 fL (ref 80.0–100.0)
Platelets: 332 10*3/uL (ref 150–440)
RBC: 3.85 MIL/uL (ref 3.80–5.20)
RDW: 13.2 % (ref 11.5–14.5)
WBC: 13.8 10*3/uL — AB (ref 3.6–11.0)

## 2016-06-08 LAB — URINALYSIS, COMPLETE (UACMP) WITH MICROSCOPIC
Bilirubin Urine: NEGATIVE
Glucose, UA: NEGATIVE mg/dL
Hgb urine dipstick: NEGATIVE
Ketones, ur: NEGATIVE mg/dL
Leukocytes, UA: NEGATIVE
Nitrite: NEGATIVE
PROTEIN: NEGATIVE mg/dL
Specific Gravity, Urine: 1.029 (ref 1.005–1.030)
pH: 5 (ref 5.0–8.0)

## 2016-06-08 LAB — GLUCOSE, CAPILLARY: Glucose-Capillary: 69 mg/dL (ref 65–99)

## 2016-06-08 LAB — BASIC METABOLIC PANEL
Anion gap: 7 (ref 5–15)
BUN: 11 mg/dL (ref 6–20)
CALCIUM: 9 mg/dL (ref 8.9–10.3)
CO2: 24 mmol/L (ref 22–32)
CREATININE: 0.7 mg/dL (ref 0.44–1.00)
Chloride: 106 mmol/L (ref 101–111)
GFR calc Af Amer: >60 mL/min (ref >60)
Glucose, Bld: 72 mg/dL (ref 65–99)
POTASSIUM: 3.7 mmol/L (ref 3.5–5.1)
SODIUM: 137 mmol/L (ref 135–145)

## 2016-06-08 LAB — HCG, QUANTITATIVE, PREGNANCY: HCG, BETA CHAIN, QUANT, S: 162008 m[IU]/mL — AB (ref <5)

## 2016-06-08 NOTE — ED Triage Notes (Signed)
Pt reports two near syncopal episodes today. Pt states the first time it happened her vision went black and the second time it went blurry. Pt reports nausea and vomiting. Pt is [redacted] weeks pregnant.

## 2016-06-08 NOTE — ED Notes (Signed)
No answer when called for vital signs 

## 2016-06-09 ENCOUNTER — Emergency Department
Admission: EM | Admit: 2016-06-09 | Discharge: 2016-06-09 | Disposition: A | Discharge status: Home / Self Care | Payer: Medicaid Other | Attending: Emergency Medicine | Admitting: Emergency Medicine

## 2016-06-10 ENCOUNTER — Telehealth: Payer: Self-pay | Admitting: Emergency Medicine

## 2016-06-10 NOTE — Telephone Encounter (Signed)
Called patient due to lwot to inquire about condition and follow up plans. She is is at her ob at [facility] now.  She will let them know we did labs here so they can review them.

## 2016-08-14 ENCOUNTER — Emergency Department: Payer: Medicaid Other

## 2016-08-14 ENCOUNTER — Emergency Department
Admission: EM | Admit: 2016-08-14 | Discharge: 2016-08-14 | Disposition: A | Discharge status: Home / Self Care | Payer: Medicaid Other | Attending: Student in an Organized Health Care Education/Training Program | Admitting: Student in an Organized Health Care Education/Training Program

## 2016-08-14 DIAGNOSIS — J4521 Mild intermittent asthma with (acute) exacerbation: Secondary | ICD-10-CM | POA: Diagnosis not present

## 2016-08-14 DIAGNOSIS — Z3A22 22 weeks gestation of pregnancy: Secondary | ICD-10-CM | POA: Insufficient documentation

## 2016-08-14 DIAGNOSIS — R0602 Shortness of breath: Secondary | ICD-10-CM | POA: Diagnosis present

## 2016-08-14 DIAGNOSIS — O9952 Diseases of the respiratory system complicating childbirth: Secondary | ICD-10-CM | POA: Insufficient documentation

## 2016-08-14 DIAGNOSIS — E079 Disorder of thyroid, unspecified: Secondary | ICD-10-CM | POA: Insufficient documentation

## 2016-08-14 MED ORDER — METHYLPREDNISOLONE SODIUM SUCC 125 MG IJ SOLR
125.0000 mg | Freq: Once | INTRAMUSCULAR | Status: AC
  Administered 2016-08-14: 125 mg via INTRAMUSCULAR
  Filled 2016-08-14: qty 2

## 2016-08-14 MED ORDER — ALBUTEROL SULFATE HFA 108 (90 BASE) MCG/ACT IN AERS: 2.0000 | INHALATION_SPRAY | Freq: Four times a day (QID) | RESPIRATORY_TRACT | 2 refills | Status: DC | PRN

## 2016-08-14 MED ORDER — ALBUTEROL SULFATE (2.5 MG/3ML) 0.083% IN NEBU: 5.0000 mg | INHALATION_SOLUTION | Freq: Once | RESPIRATORY_TRACT | Status: DC

## 2016-08-14 MED ORDER — IPRATROPIUM-ALBUTEROL 0.5-2.5 (3) MG/3ML IN SOLN
3.0000 mL | Freq: Once | RESPIRATORY_TRACT | Status: AC
  Administered 2016-08-14: 3 mL via RESPIRATORY_TRACT
  Filled 2016-08-14: qty 3

## 2016-08-14 NOTE — ED Provider Notes (Signed)
[facility] Emergency Department Provider Note  ____________________________________________  Time seen: Approximately 6:40 PM  I have reviewed the triage vital signs and the nursing notes.   HISTORY  Chief Complaint Cough and Shortness of Breath    HPI Jessica Wong is a 31 y.o. female presenting to the emergency department with rhinorrhea, congestion, nonproductive cough and shortness of breath for the past 2 days. Patient has a history of asthma. She has been afebrile. She is tolerating fluids and food by mouth. Patient is [redacted] weeks pregnant. She denies associated abdominal pain, nausea, vaginal bleeding or gush of fluid. Patient denies recent surgery, prolonged immobility, history of DVT/PE or recent travel. Patient states that her symptoms feel similar to prior asthma exacerbations in the past. No alleviating measures have been attempted aside from albuterol.   Past Medical History:  Diagnosis Date  . Asthma   . Thyroid disease     There are no active problems to display for this patient.   Past Surgical History:  Procedure Laterality Date  . APPENDECTOMY    . CHOLECYSTECTOMY    . DILATION AND CURETTAGE OF UTERUS    . KNEE SURGERY Right     Prior to Admission medications   Medication Sig Start Date End Date Taking? Authorizing Provider  albuterol (PROVENTIL HFA;VENTOLIN HFA) 108 (90 Base) MCG/ACT inhaler Inhale 2 puffs into the lungs every 6 (six) hours as needed for wheezing or shortness of breath. 03/15/16   Darci Current, MD  brompheniramine-pseudoephedrine-DM 30-2-10 MG/5ML syrup Take 5 mLs by mouth 4 (four) times daily as needed. 04/30/16   Joni Reining, PA-C  chlorpheniramine-HYDROcodone (TUSSIONEX PENNKINETIC ER) 10-8 MG/5ML SUER Take 5 mLs by mouth every 12 (twelve) hours as needed for cough. Patient not taking: Reported on 04/12/2016 03/15/16   Darci Current, MD  clotrimazole (LOTRIMIN) 1 % cream Applied to areas beneath the breasts  twice a day for rash. Patient not taking: Reported on 04/12/2016 11/12/15   Tommi Rumps, PA-C  etodolac (LODINE) 200 MG capsule Take 1 capsule (200 mg total) by mouth every 8 (eight) hours. Patient not taking: Reported on 04/12/2016 02/13/16   Rebecka Apley, MD  hydrOXYzine (ATARAX/VISTARIL) 25 MG tablet Take 1 tablet (25 mg total) by mouth every 6 (six) hours as needed for itching. Patient not taking: Reported on 04/12/2016 11/12/15   Tommi Rumps, PA-C  ibuprofen (ADVIL,MOTRIN) 200 MG tablet Take 800 mg by mouth 2 (two) times daily.    [provider]  lidocaine (LIDODERM) 5 % Place 1 patch onto the skin every 12 (twelve) hours. Remove & Discard patch within 12 hours or as directed by MD Patient not taking: Reported on 04/12/2016 02/13/16 02/12/17  Rebecka Apley, MD  LORazepam (ATIVAN) 1 MG tablet Take 1 tablet (1 mg total) by mouth every 8 (eight) hours as needed for anxiety. 09/24/15 09/23/16  Sharyn Creamer, MD  ondansetron (ZOFRAN ODT) 4 MG disintegrating tablet Take 1 tablet (4 mg total) by mouth every 6 (six) hours as needed for nausea or vomiting. Patient not taking: Reported on 04/12/2016 09/24/15   Sharyn Creamer, MD  oxyCODONE (ROXICODONE) 5 MG immediate release tablet Take 1 tablet (5 mg total) by mouth every 4 (four) hours as needed. Patient not taking: Reported on 04/12/2016 11/12/15 11/11/16  Tommi Rumps, PA-C  oxyCODONE-acetaminophen (ROXICET) 5-325 MG tablet Take 1 tablet by mouth every 6 (six) hours as needed. 04/12/16 04/12/17  Jeanmarie Plant, MD  traMADol Janean Sark)  50 MG tablet Take 1 tablet (50 mg total) by mouth every 6 (six) hours as needed. Patient not taking: Reported on 04/12/2016 08/24/15   Hagler, Jami L, PA-C    Allergies Tramadol; Flagyl [metronidazole]; Bactrim [sulfamethoxazole-trimethoprim]; and Tape  No family history on file.  Social History Social History  Substance Use Topics  . Smoking status: Never Smoker  . Smokeless tobacco: Never Used  . Alcohol  use No    Review of Systems  Constitutional: Patient has been afebrile Eyes: No visual changes. No discharge ENT: Patient has had congestion.  Cardiovascular: no chest pain. Respiratory: Patient has had non-productive cough.  Patient has shortness of breath. Gastrointestinal: Patient has had nausea.  Genitourinary: Negative for dysuria. No hematuria Musculoskeletal: Patient has had myalgias. Skin: Negative for rash, abrasions, lacerations, ecchymosis. Neurological: Negative for headaches, focal weakness or numbness.   ____________________________________________   PHYSICAL EXAM:  VITAL SIGNS: ED Triage Vitals [08/14/16 1806]  Enc Vitals Group     BP      Pulse Rate 94     Resp 16     Temp 98.5 F (36.9 C)     Temp Source Oral     SpO2 99 %     Weight 205 lb (93 kg)     Height 5\' 4"  (1.626 m)     Head Circumference      Peak Flow      Pain Score      Pain Loc      Pain Edu?      Excl. in GC?      Constitutional: Alert and oriented. Patient is lying supine in bed.  Eyes: Conjunctivae are normal. PERRL. EOMI. Head: Atraumatic. ENT:      Ears: Tympanic membranes are injected bilaterally without evidence of effusion or purulent exudate. Bony landmarks are visualized bilaterally. No pain with palpation at the tragus.      Nose: Nasal turbinates are edematous and erythematous. Trace rhinorrhea visualized.      Mouth/Throat: Mucous membranes are moist. Posterior pharynx is mildly erythematous. No tonsillar hypertrophy or purulent exudate. Uvula is midline. Neck: Full range of motion. No pain is elicited with flexion at the neck. Hematological/Lymphatic/Immunilogical: No cervical lymphadenopathy. Cardiovascular: Normal rate, regular rhythm. Normal S1 and S2.  Good peripheral circulation. Respiratory: Normal respiratory effort without tachypnea or retractions. Lungs CTAB. Good air entry to the bases with no decreased or absent breath sounds. Gastrointestinal: Bowel sounds  4 quadrants. Soft and nontender to palpation. No guarding or rigidity. No palpable masses. No distention. No CVA tenderness.  Skin:  Skin is warm, dry and intact. No rash noted. Psychiatric: Mood and affect are normal. Speech and behavior are normal. Patient exhibits appropriate insight and judgement.  ____________________________________________   LABS (all labs ordered are listed, but only abnormal results are displayed)  Labs Reviewed - No data to display ____________________________________________  EKG   ____________________________________________  RADIOLOGY Geraldo PitterI, Rohn Fritsch M Jamontae Thwaites, personally viewed and evaluated these images (plain radiographs) as part of my medical decision making, as well as reviewing the written report by the radiologist.   Dg Chest 2 View  Result Date: 08/14/2016 CLINICAL DATA:  Cough and dyspnea for 2 days EXAM: CHEST  2 VIEW COMPARISON:  03/14/2016 chest radiograph. FINDINGS: Stable cardiomediastinal silhouette with normal heart size. No pneumothorax. No pleural effusion. Lungs appear clear, with no acute consolidative airspace disease and no pulmonary edema. IMPRESSION: No active cardiopulmonary disease. Electronically Signed   By: Delbert PhenixJason A Poff M.D.   On:  08/14/2016 19:09    ____________________________________________    PROCEDURES  Procedure(s) performed:    Procedures    Medications  methylPREDNISolone sodium succinate (SOLU-MEDROL) 125 mg/2 mL injection 125 mg (125 mg Intramuscular Given 08/14/16 1845)  ipratropium-albuterol (DUONEB) 0.5-2.5 (3) MG/3ML nebulizer solution 3 mL (3 mLs Nebulization Given 08/14/16 1846)     ____________________________________________   INITIAL IMPRESSION / ASSESSMENT AND PLAN / ED COURSE  Pertinent labs & imaging results that were available during my care of the patient were reviewed by me and considered in my medical decision making (see chart for details).  Review of the [place] CSRS was performed in accordance  of the [facility] prior to dispensing any controlled drugs.    Assessment and Plan:  Asthma Exacerbation: Patient presents to the emergency department with  nonproductive cough, rhinorrhea, congestion and shortness of breath. Patient is [redacted] weeks pregnant. Fetal heart tones assessed in the emergency department. Patient was given an injection of Solu-Medrol in the emergency department. Patient states that shortness of breath improved with DuoNeb and Solu-Medrol administration. Patient denies recent surgery, prolonged immobility, smoking or recent travel. Further workup with a CTA is not warranted at this time. Asthma exacerbation is likely. Vital signs were reassuring prior to discharge. All patient questions were answered.   ____________________________________________  FINAL CLINICAL IMPRESSION(S) / ED DIAGNOSES  Final diagnoses:  Mild intermittent asthma with exacerbation      NEW MEDICATIONS STARTED DURING THIS VISIT:  New Prescriptions   No medications on file        This chart was dictated using voice recognition software/Dragon. Despite best efforts to proofread, errors can occur which can change the meaning. Any change was purely unintentional.    Orvil Feil, PA-C 08/14/16 1937    Willy Eddy, MD 08/14/16 2041

## 2016-08-14 NOTE — ED Notes (Signed)
Pt presents with cough & tightness/sob x 2 days. Pt denies fevers. Pt alert & oriented with NAD noted.

## 2016-08-14 NOTE — ED Triage Notes (Signed)
Nonproductive cough and sob X 2 days. Pt alert and oriented X4, active, cooperative, pt in NAD. RR even and unlabored, color WNL.

## 2016-08-14 NOTE — ED Notes (Signed)
All previous notes entered in error.

## 2016-09-13 ENCOUNTER — Observation Stay
Admission: EM | Admit: 2016-09-13 | Discharge: 2016-09-13 | Disposition: A | Discharge status: Home / Self Care | Payer: Medicaid Other | Attending: Obstetrics & Gynecology | Admitting: Obstetrics & Gynecology

## 2016-09-13 ENCOUNTER — Encounter: Payer: Self-pay | Admitting: *Deleted

## 2016-09-13 DIAGNOSIS — Z885 Allergy status to narcotic agent status: Secondary | ICD-10-CM | POA: Insufficient documentation

## 2016-09-13 DIAGNOSIS — Z9889 Other specified postprocedural states: Secondary | ICD-10-CM | POA: Insufficient documentation

## 2016-09-13 DIAGNOSIS — O99512 Diseases of the respiratory system complicating pregnancy, second trimester: Secondary | ICD-10-CM | POA: Insufficient documentation

## 2016-09-13 DIAGNOSIS — E079 Disorder of thyroid, unspecified: Secondary | ICD-10-CM | POA: Diagnosis not present

## 2016-09-13 DIAGNOSIS — M5432 Sciatica, left side: Secondary | ICD-10-CM

## 2016-09-13 DIAGNOSIS — M5442 Lumbago with sciatica, left side: Secondary | ICD-10-CM | POA: Diagnosis not present

## 2016-09-13 DIAGNOSIS — Z883 Allergy status to other anti-infective agents status: Secondary | ICD-10-CM | POA: Insufficient documentation

## 2016-09-13 DIAGNOSIS — Z882 Allergy status to sulfonamides status: Secondary | ICD-10-CM | POA: Insufficient documentation

## 2016-09-13 DIAGNOSIS — O99282 Endocrine, nutritional and metabolic diseases complicating pregnancy, second trimester: Secondary | ICD-10-CM | POA: Insufficient documentation

## 2016-09-13 DIAGNOSIS — Z91048 Other nonmedicinal substance allergy status: Secondary | ICD-10-CM | POA: Diagnosis not present

## 2016-09-13 DIAGNOSIS — Z79899 Other long term (current) drug therapy: Secondary | ICD-10-CM | POA: Diagnosis not present

## 2016-09-13 DIAGNOSIS — J45909 Unspecified asthma, uncomplicated: Secondary | ICD-10-CM | POA: Diagnosis not present

## 2016-09-13 DIAGNOSIS — Z9049 Acquired absence of other specified parts of digestive tract: Secondary | ICD-10-CM | POA: Diagnosis not present

## 2016-09-13 DIAGNOSIS — O26892 Other specified pregnancy related conditions, second trimester: Principal | ICD-10-CM | POA: Insufficient documentation

## 2016-09-13 DIAGNOSIS — Z881 Allergy status to other antibiotic agents status: Secondary | ICD-10-CM | POA: Diagnosis not present

## 2016-09-13 DIAGNOSIS — Z3A26 26 weeks gestation of pregnancy: Secondary | ICD-10-CM

## 2016-09-13 DIAGNOSIS — M545 Low back pain: Secondary | ICD-10-CM | POA: Diagnosis not present

## 2016-09-13 MED ORDER — CYCLOBENZAPRINE HCL 10 MG PO TABS: 10.0000 mg | ORAL_TABLET | Freq: Three times a day (TID) | ORAL | 0 refills | Status: DC | PRN

## 2016-09-13 NOTE — Discharge Summary (Signed)
See final progress note. 

## 2016-09-13 NOTE — Final Progress Note (Signed)
Physician Final Progress Note  Patient ID: Jessica Wong MRN: [id number] DOB/AGE: [birth year] 31 y.o.  Admit date: 09/13/2016 Admitting provider: Thomasene MohairStephen Anaaya Fuster, MD  Discharge date: 09/13/2016  Admission Diagnoses:  1) intrauterine pregnancy at 5564w2d  2) lower back pain  Discharge Diagnoses:  1) intrauterine pregnancy at 2164w2d  2) left sciatic nerve pain  History of Present Illness: The patient is a 31 y.o. female G3P2002 at 1164w2d who presents for left lower back pain that started about three days ago. The pain is located in her left buttock, near her SI joint. The pain radiates down her leg posteriorly and laterally.  Movement makes the pain worse.  Nothing makes the pain better. She has tried tylenol and heat without improvement.  She denies fevers, chills, numbness and weakness in her leg.  She denies an inciting event. She denies a history of similar pain.  No other associated symptoms. The pain is rated as severe, as she has tried to tolerate the pain for the last several days and the pain is only getting worse.  She notes +FM, no LOF, no vaginal bleeding, and no contractions.  Hospital Course: Admitted to Labor and Delivery for the above. Normal fetal testing and normal vital signs. Exam and history consistent with sciatic pain on the left.  Exercises and medication discussed.  Rx for flexeril given. She may also take Tylenol and small amounts of ibuprofen (no more than 400 mg at a time, no more than twice daily for two days).  She was discharged home in stable condition with plan for follow up at her regular OB/GYN appointment in 3 days.   Past Medical History:  Diagnosis Date  . Asthma   . Thyroid disease    Past Surgical History:  Procedure Laterality Date  . APPENDECTOMY    . CHOLECYSTECTOMY    . DILATION AND CURETTAGE OF UTERUS    . KNEE SURGERY Right    No current facility-administered medications on file prior to encounter.    Current Outpatient Prescriptions on File  Prior to Encounter  Medication Sig Dispense Refill  . albuterol (PROVENTIL HFA;VENTOLIN HFA) 108 (90 Base) MCG/ACT inhaler Inhale 2 puffs into the lungs every 6 (six) hours as needed for wheezing or shortness of breath. 1 Inhaler 2    Allergies  Allergen Reactions  . Flagyl [Metronidazole] Nausea Only  . Bactrim [Sulfamethoxazole-Trimethoprim] Rash  . Tape Rash  . Tramadol     Urinary retention    Social History   Social History  . Marital status: Married    Spouse name: N/A  . Number of children: N/A  . Years of education: N/A   Occupational History  . Not on file.   Social History Main Topics  . Smoking status: Never Smoker  . Smokeless tobacco: Never Used  . Alcohol use No  . Drug use: No  . Sexual activity: Not on file   Other Topics Concern  . Not on file   Social History Narrative  . No narrative on file    Physical Exam: BP 114/67 (BP Location: Right Arm)   Pulse 78   Temp 97.7 F (36.5 C) (Oral)   Resp 18   Ht 5\' 4"  (1.626 m)   Wt 210 lb (95.3 kg)   LMP 03/13/2016   BMI 36.05 kg/m   Gen: NAD CV: RRR Pulm: CTAB Pelvic: deferred Abd: gravid, nontender Back: no CVAT, pain provoked at left SI joint.  No weakness or loss of sensation in left lower  extremity. Ext: as above. No erythema, cords, tenderness  Consults: None  Significant Findings/ Diagnostic Studies: None  Procedures: fetal assessment Normal fetal heart tones given gestational age. No activity on tocometry  Discharge Condition: stable  Disposition: 01-Home or Self Care  Diet: Regular diet  Discharge Activity: Activity as tolerated   Allergies as of 09/13/2016      Reactions   Flagyl [metronidazole] Nausea Only   Bactrim [sulfamethoxazole-trimethoprim] Rash   Tape Rash   Tramadol    Urinary retention      Medication List    STOP taking these medications   brompheniramine-pseudoephedrine-DM 30-2-10 MG/5ML syrup   chlorpheniramine-HYDROcodone 10-8 MG/5ML Suer Commonly  known as:  TUSSIONEX PENNKINETIC ER   clotrimazole 1 % cream Commonly known as:  LOTRIMIN   etodolac 200 MG capsule Commonly known as:  LODINE   hydrOXYzine 25 MG tablet Commonly known as:  ATARAX/VISTARIL   ibuprofen 200 MG tablet Commonly known as:  ADVIL,MOTRIN   lidocaine 5 % Commonly known as:  LIDODERM   LORazepam 1 MG tablet Commonly known as:  ATIVAN   ondansetron 4 MG disintegrating tablet Commonly known as:  ZOFRAN ODT   oxyCODONE 5 MG immediate release tablet Commonly known as:  ROXICODONE   oxyCODONE-acetaminophen 5-325 MG tablet Commonly known as:  ROXICET   traMADol 50 MG tablet Commonly known as:  ULTRAM     TAKE these medications   acetaminophen 500 MG tablet Commonly known as:  TYLENOL Take 1,000 mg by mouth every 6 (six) hours as needed.   albuterol 108 (90 Base) MCG/ACT inhaler Commonly known as:  PROVENTIL HFA;VENTOLIN HFA Inhale 2 puffs into the lungs every 6 (six) hours as needed for wheezing or shortness of breath.   cyclobenzaprine 10 MG tablet Commonly known as:  FLEXERIL Take 1 tablet (10 mg total) by mouth 3 (three) times daily as needed for muscle spasms.   multivitamin-prenatal 27-0.8 MG Tabs tablet Take 1 tablet by mouth daily at 12 noon.      Follow-up Information    [place], [facility] Follow up on 09/16/2016.   Why:  Keep previously scheduled appointment Contact information: [facility] [address] [place] [address] [phone]           Total time spent taking care of this patient: 20 minutes  Signed: Thomasene Mohair, MD  09/13/2016, 11:25 AM

## 2016-09-13 NOTE — OB Triage Note (Signed)
Back pain since Tuesday. Started suddenly while at work. Pt works as a Production assistant, radioserver. Pain worsening since Tuesday. Denies urinary symptoms. Denies vaginal bleeding. Jessica Wong, Jessica Wong

## 2016-12-06 ENCOUNTER — Encounter: Payer: Self-pay | Admitting: Emergency Medicine

## 2016-12-06 DIAGNOSIS — J45909 Unspecified asthma, uncomplicated: Secondary | ICD-10-CM | POA: Insufficient documentation

## 2016-12-06 DIAGNOSIS — R102 Pelvic and perineal pain: Secondary | ICD-10-CM | POA: Diagnosis not present

## 2016-12-06 DIAGNOSIS — Z79899 Other long term (current) drug therapy: Secondary | ICD-10-CM | POA: Diagnosis not present

## 2016-12-06 DIAGNOSIS — O9953 Diseases of the respiratory system complicating the puerperium: Secondary | ICD-10-CM | POA: Insufficient documentation

## 2016-12-06 LAB — TYPE AND SCREEN
ABO/RH(D): O POS
Antibody Screen: NEGATIVE

## 2016-12-06 LAB — CBC
HCT: 34.9 % — ABNORMAL LOW (ref 35.0–47.0)
HEMOGLOBIN: 12.1 g/dL (ref 12.0–16.0)
MCH: 31.2 pg (ref 26.0–34.0)
MCHC: 34.7 g/dL (ref 32.0–36.0)
MCV: 90 fL (ref 80.0–100.0)
Platelets: 399 10*3/uL (ref 150–440)
RBC: 3.88 MIL/uL (ref 3.80–5.20)
RDW: 13.2 % (ref 11.5–14.5)
WBC: 10.2 10*3/uL (ref 3.6–11.0)

## 2016-12-06 NOTE — ED Triage Notes (Signed)
Pt reports she gave birth on 9/20, vaginal birth/no complications and since has had moderate bleeding and in last 24hours pt has had heavy bleeding with clots the size of her "fist." Pt reports she had a post partum hemorrhage with last pregnancy in 2014.

## 2016-12-07 ENCOUNTER — Emergency Department: Payer: Medicaid Other

## 2016-12-07 ENCOUNTER — Emergency Department
Admission: EM | Admit: 2016-12-07 | Discharge: 2016-12-07 | Disposition: A | Discharge status: Home / Self Care | Payer: Medicaid Other | Attending: Emergency Medicine | Admitting: Emergency Medicine

## 2016-12-07 DIAGNOSIS — N939 Abnormal uterine and vaginal bleeding, unspecified: Secondary | ICD-10-CM

## 2016-12-07 MED ORDER — OXYCODONE-ACETAMINOPHEN 5-325 MG PO TABS
ORAL_TABLET | ORAL | Status: AC
  Filled 2016-12-07: qty 1

## 2016-12-07 MED ORDER — OXYCODONE-ACETAMINOPHEN 5-325 MG PO TABS
1.0000 | ORAL_TABLET | Freq: Once | ORAL | Status: AC
  Administered 2016-12-07: 1 via ORAL

## 2016-12-07 MED ORDER — SODIUM CHLORIDE 0.9 % IV BOLUS (SEPSIS)
1000.0000 mL | Freq: Once | INTRAVENOUS | Status: AC
  Administered 2016-12-07: 1000 mL via INTRAVENOUS

## 2016-12-07 NOTE — ED Provider Notes (Signed)
[facility] Emergency Department Provider Note   ____________________________________________   First MD Initiated Contact with Patient 12/07/16 [phone]     (approximate)  I have reviewed the triage vital signs and the nursing notes.   HISTORY  Chief Complaint Vaginal Bleeding    HPI Jessica Wong is a 31 y.o. female who comes into the hospital today with some vaginal bleeding. The patient had a baby 8 days ago and today was passing clots the size of a fist. She also reports that she had some episodes of heavy bleeding. She did not call her OB/GYN which is at [facility]. She states that she developed a lightheaded and dizzy. The patient also has a headache with some back pain and cramping. She states that the same thing happened when she hemorrhage in 2014. She reports that she had been admitted and discharged multiple times. She states that after about a month she received a D&C. The patient was concerned she decided to come into the hospital today for further evaluation of this vaginal bleeding.   Past Medical History:  Diagnosis Date  . Asthma   . Thyroid disease     Patient Active Problem List   Diagnosis Date Noted  . Labor and delivery indication for care or intervention 09/13/2016    Past Surgical History:  Procedure Laterality Date  . APPENDECTOMY    . CHOLECYSTECTOMY    . DILATION AND CURETTAGE OF UTERUS    . KNEE SURGERY Right     Prior to Admission medications   Medication Sig Start Date End Date Taking? Authorizing Provider  acetaminophen (TYLENOL) 500 MG tablet Take 1,000 mg by mouth every 6 (six) hours as needed.    [provider]  albuterol (PROVENTIL HFA;VENTOLIN HFA) 108 (90 Base) MCG/ACT inhaler Inhale 2 puffs into the lungs every 6 (six) hours as needed for wheezing or shortness of breath. 08/14/16   Orvil Feil, PA-C  cyclobenzaprine (FLEXERIL) 10 MG tablet Take 1 tablet (10 mg total) by mouth 3 (three) times  daily as needed for muscle spasms. 09/13/16   Conard Novak, MD  Prenatal Vit-Fe Fumarate-FA (MULTIVITAMIN-PRENATAL) 27-0.8 MG TABS tablet Take 1 tablet by mouth daily at 12 noon.    [provider]    Allergies Flagyl [metronidazole]; Bactrim [sulfamethoxazole-trimethoprim]; Tape; and Tramadol  History reviewed. No pertinent family history.  Social History Social History  Substance Use Topics  . Smoking status: Never Smoker  . Smokeless tobacco: Never Used  . Alcohol use No    Review of Systems  Constitutional: No fever/chills Eyes: No visual changes. ENT: No sore throat. Cardiovascular: Denies chest pain. Respiratory: Denies shortness of breath. Gastrointestinal:  abdominal pain.  No nausea, no vomiting.  No diarrhea.  No constipation. Genitourinary: vaginal bleeding Musculoskeletal: back pain. Skin: Negative for rash. Neurological: headache, lightheadedness   ____________________________________________   PHYSICAL EXAM:  VITAL SIGNS: ED Triage Vitals  Enc Vitals Group     BP 12/06/16 2143 (!) 107/91     Pulse Rate 12/06/16 2143 84     Resp 12/06/16 2143 16     Temp 12/06/16 2143 98 F (36.7 C)     Temp Source 12/06/16 2143 Oral     SpO2 12/06/16 2143 97 %     Weight --      Height --      Head Circumference --      Peak Flow --      Pain Score 12/07/16 0027 8  Pain Loc --      Pain Edu? --      Excl. in GC? --     Constitutional: Alert and oriented. Well appearing and in moderate distress. patient is tearful and crying Eyes: Conjunctivae are normal. PERRL. EOMI. Head: Atraumatic. Nose: No congestion/rhinnorhea. Mouth/Throat: Mucous membranes are moist.  Oropharynx non-erythematous. Cardiovascular: Normal rate, regular rhythm. Grossly normal heart sounds.  Good peripheral circulation. Respiratory: Normal respiratory effort.  No retractions. Lungs CTAB. Gastrointestinal: Soft diffuse lower abdominal tenderness to palpation. No distention.  No abdominal bruits. No CVA tenderness. Genitourinary: normal external genitalia with some mild blood in vaginal vault, no heavy or excessive bleeding. Fundus is firm the patient does have some abdominal pain to palpation Musculoskeletal: No lower extremity tenderness nor edema.   Neurologic:  Normal speech and language. . Skin:  Skin is warm, dry and intact.  Psychiatric: Mood and affect are normal.   ____________________________________________   LABS (all labs ordered are listed, but only abnormal results are displayed)  Labs Reviewed  CBC - Abnormal; Notable for the following:       Result Value   HCT 34.9 (*)    All other components within normal limits  TYPE AND SCREEN   ____________________________________________  EKG  none ____________________________________________  RADIOLOGY  US Pelvis Complete  Result Date: 12/07/2016 CLINICAL DATA:  31 y/o F; 8 weeks postpartum with increased bleeding. EXAM: TRANSABDOMINAL ULTRASOUND OF PELVIS TECHNIQUE: Transabdominal ultrasound examination of the pelvis was performed including evaluation of the uterus, ovaries, adnexal regions, and pelvic cul-de-sac. COMPARISON:  None. FINDINGS: Uterus Measurements: 10.9 x 7.4 x 5.0 cm. No fibroids or other mass visualized. Endometrium Thickness: 21 mm. The endometrium is thickened within the uterine fundus containing hypoechoic avascular material with lacy septations. No echogenic mass or abnormal vascularity. Right ovary Measurements: 3.6 x 1.6 x 2.5 cm. Normal appearance/no adnexal mass. Left ovary Measurements: 2.9 x 1.6 x 2.6 cm. Normal appearance/no adnexal mass. Other findings:  No abnormal free fluid. IMPRESSION: No typical findings of retained products of conception. The fundal endometrium is thickened to 21 mm with heterogeneous avascular hypoechoic material favored to represent retained blood clot. A retained blood clot should resolve, consider short interval pelvic ultrasound to ensure  resolution. Electronically Signed   By: Mitzi Hansen M.D.   On: 12/07/2016 02:21    ____________________________________________   PROCEDURES  Procedure(s) performed: None  Procedures  Critical Care performed: No  ____________________________________________   INITIAL IMPRESSION / ASSESSMENT AND PLAN / ED COURSE  Pertinent labs & imaging results that were available during my care of the patient were reviewed by me and considered in my medical decision making (see chart for details).  this is a 31 year old female who comes into the hospital today with vaginal bleeding. She is 8 days status post having a baby. We did perform a CBC which showed a hemoglobin of 12.1 which is high again it was when she delivered on 9/20. I also sent the patient for an ultrasound which showed a clot in the fundus and was discussed that it should be resolved.   The differential diagnosis included retained products of conception, boggy uterus, postpartum hemorrhage.  The patient is not bleeding severely at this time. I did contact Dr. Greggory Keen who stated that if the patient's uterus was firm then there was really not much to do but she can follow-up. He reports though that if her uterus is boggy we could start her on Methergine. I did contact the labor and delivery  floor to have their staff double check that the patient's fundus is firm. It did feel that way but I wanted to be sure. Labor and delivery staff came down and reported that she doesn't have any bleeding with palpation of her fundus and the fundus is low in the pelvis and is firm. The patient will be discharged to home to follow up with her OB/GYN for further evaluation.      ____________________________________________   FINAL CLINICAL IMPRESSION(S) / ED DIAGNOSES  Final diagnoses:  Abnormal uterine bleeding  Postpartum hemorrhage, unspecified type      NEW MEDICATIONS STARTED DURING THIS VISIT:  Discharge Medication  List as of 12/07/2016  4:28 AM       Note:  This document was prepared using Dragon voice recognition software and may include unintentional dictation errors.    Rebecka Apley, MD 12/07/16 [phone]

## 2016-12-07 NOTE — ED Notes (Signed)
Signature pad not responding, pt signed hard copy of ED discharge and placed on her chart

## 2016-12-07 NOTE — ED Notes (Addendum)
Pt had baby 8 days ago and today she started passing large blood clots and reports "massive amounts of bleeding" after the blood clots come out - this started this am - pt has a history of post partum hemorrhage - pt reports that blood has a "foul odor"

## 2016-12-07 NOTE — ED Notes (Signed)
Patient transported to Ultrasound 

## 2016-12-07 NOTE — Discharge Instructions (Signed)
Please follow up with [facility] in 2-3 days for further evaluation of this bleeding. At this time he bleeding has improved and Signs are unremarkable. Please return with any worsening condition or any other concerns.

## 2017-03-25 ENCOUNTER — Emergency Department
Admission: EM | Admit: 2017-03-25 | Discharge: 2017-03-25 | Discharge status: Against Medical Advice | Payer: Self-pay | Attending: Emergency Medicine | Admitting: Emergency Medicine

## 2017-03-25 ENCOUNTER — Other Ambulatory Visit: Payer: Self-pay

## 2017-03-25 ENCOUNTER — Emergency Department: Payer: Medicaid Other

## 2017-03-25 DIAGNOSIS — Z5321 Procedure and treatment not carried out due to patient leaving prior to being seen by health care provider: Secondary | ICD-10-CM | POA: Insufficient documentation

## 2017-03-25 DIAGNOSIS — R079 Chest pain, unspecified: Secondary | ICD-10-CM | POA: Insufficient documentation

## 2017-03-25 LAB — COMPREHENSIVE METABOLIC PANEL
ALBUMIN: 4.6 g/dL (ref 3.5–5.0)
ALT: 19 U/L (ref 14–54)
AST: 23 U/L (ref 15–41)
Alkaline Phosphatase: 115 U/L (ref 38–126)
Anion gap: 10 (ref 5–15)
BUN: 9 mg/dL (ref 6–20)
CHLORIDE: 103 mmol/L (ref 101–111)
CO2: 28 mmol/L (ref 22–32)
Calcium: 9.4 mg/dL (ref 8.9–10.3)
Creatinine, Ser: 0.75 mg/dL (ref 0.44–1.00)
GFR calc Af Amer: >60 mL/min (ref >60)
Glucose, Bld: 86 mg/dL (ref 65–99)
POTASSIUM: 3.3 mmol/L — AB (ref 3.5–5.1)
Sodium: 141 mmol/L (ref 135–145)
Total Bilirubin: 0.7 mg/dL (ref 0.3–1.2)
Total Protein: 7.7 g/dL (ref 6.5–8.1)

## 2017-03-25 LAB — CBC
HCT: 38.3 % (ref 35.0–47.0)
Hemoglobin: 13.2 g/dL (ref 12.0–16.0)
MCH: 30 pg (ref 26.0–34.0)
MCHC: 34.5 g/dL (ref 32.0–36.0)
MCV: 86.9 fL (ref 80.0–100.0)
PLATELETS: 401 10*3/uL (ref 150–440)
RBC: 4.4 MIL/uL (ref 3.80–5.20)
RDW: 13.7 % (ref 11.5–14.5)
WBC: 10 10*3/uL (ref 3.6–11.0)

## 2017-03-25 LAB — TROPONIN I

## 2017-03-25 NOTE — ED Notes (Signed)
No answer when called several times from lobby for xray 

## 2017-03-25 NOTE — ED Notes (Signed)
No answer when called several times from lobby for xray

## 2017-03-25 NOTE — ED Notes (Signed)
No answer when called several times from lobby 

## 2017-03-25 NOTE — ED Triage Notes (Signed)
Pt in with co midsternal chest pain that started 1 hr pta. No hx of the same or hx of cardiac disease. States some shob, none noted. Denies any current illness or sickness.

## 2017-03-26 ENCOUNTER — Telehealth: Payer: Self-pay | Admitting: Emergency Medicine

## 2017-03-26 NOTE — Telephone Encounter (Signed)
Called patient due to lwot to inquire about condition and follow up plans. Left message.   

## 2017-08-28 ENCOUNTER — Other Ambulatory Visit: Payer: Self-pay

## 2017-08-28 ENCOUNTER — Emergency Department
Admission: EM | Admit: 2017-08-28 | Discharge: 2017-08-28 | Disposition: A | Discharge status: Home / Self Care | Payer: Self-pay | Attending: Emergency Medicine | Admitting: Emergency Medicine

## 2017-08-28 ENCOUNTER — Emergency Department: Payer: Self-pay

## 2017-08-28 DIAGNOSIS — J45909 Unspecified asthma, uncomplicated: Secondary | ICD-10-CM | POA: Insufficient documentation

## 2017-08-28 DIAGNOSIS — R1084 Generalized abdominal pain: Secondary | ICD-10-CM | POA: Insufficient documentation

## 2017-08-28 DIAGNOSIS — R109 Unspecified abdominal pain: Secondary | ICD-10-CM

## 2017-08-28 LAB — CBC
HEMATOCRIT: 37.2 % (ref 35.0–47.0)
Hemoglobin: 12.9 g/dL (ref 12.0–16.0)
MCH: 30.3 pg (ref 26.0–34.0)
MCHC: 34.7 g/dL (ref 32.0–36.0)
MCV: 87.5 fL (ref 80.0–100.0)
PLATELETS: 355 10*3/uL (ref 150–440)
RBC: 4.26 MIL/uL (ref 3.80–5.20)
RDW: 13.7 % (ref 11.5–14.5)
WBC: 9.4 10*3/uL (ref 3.6–11.0)

## 2017-08-28 LAB — BASIC METABOLIC PANEL
Anion gap: 9 (ref 5–15)
BUN: 11 mg/dL (ref 6–20)
CHLORIDE: 107 mmol/L (ref 101–111)
CO2: 22 mmol/L (ref 22–32)
CREATININE: 0.74 mg/dL (ref 0.44–1.00)
Calcium: 8.6 mg/dL — ABNORMAL LOW (ref 8.9–10.3)
GFR calc non Af Amer: >60 mL/min (ref >60)
Glucose, Bld: 92 mg/dL (ref 65–99)
Potassium: 3.2 mmol/L — ABNORMAL LOW (ref 3.5–5.1)
Sodium: 138 mmol/L (ref 135–145)

## 2017-08-28 LAB — URINALYSIS, COMPLETE (UACMP) WITH MICROSCOPIC
BILIRUBIN URINE: NEGATIVE
Glucose, UA: NEGATIVE mg/dL
HGB URINE DIPSTICK: NEGATIVE
Ketones, ur: NEGATIVE mg/dL
NITRITE: NEGATIVE
PH: 6 (ref 5.0–8.0)
Protein, ur: NEGATIVE mg/dL
SPECIFIC GRAVITY, URINE: 1.008 (ref 1.005–1.030)

## 2017-08-28 LAB — POCT PREGNANCY, URINE: Preg Test, Ur: NEGATIVE

## 2017-08-28 MED ORDER — MORPHINE SULFATE (PF) 4 MG/ML IV SOLN: 4.0000 mg | Freq: Once | INTRAVENOUS | Status: DC

## 2017-08-28 MED ORDER — MORPHINE SULFATE (PF) 2 MG/ML IV SOLN
INTRAVENOUS | Status: AC
  Administered 2017-08-28: 4 mg via INTRAMUSCULAR
  Filled 2017-08-28: qty 2

## 2017-08-28 MED ORDER — HYDROCODONE-ACETAMINOPHEN 5-325 MG PO TABS: 1.0000 | ORAL_TABLET | ORAL | 0 refills | Status: DC | PRN

## 2017-08-28 MED ORDER — ONDANSETRON 4 MG PO TBDP
4.0000 mg | ORAL_TABLET | Freq: Once | ORAL | Status: AC
Start: 2017-08-28 — End: 2017-08-28
  Administered 2017-08-28: 4 mg via ORAL
  Filled 2017-08-28: qty 1

## 2017-08-28 NOTE — ED Notes (Signed)
Pt reports having nausea and left sided flank pain for past three days. Pt alert and oriented x 4.

## 2017-08-28 NOTE — ED Triage Notes (Signed)
Pt c/o left flank pain for the past 3 days with nausea, vomiting today. Denies hx of kidney stones. Denies painful urination.

## 2017-08-28 NOTE — ED Provider Notes (Signed)
[facility] Emergency Department Provider Note  Time seen: 3:26 PM  I have reviewed the triage vital signs and the nursing notes.   HISTORY  Chief Complaint Flank Pain    HPI Jessica Wong is a 32 y.o. female with no significant past medical history besides asthma presents to the emergency department for left flank pain.  According to the patient for the past 3 days she has been expensing left flank pain, initially intermittent now constant and sharp.  States nausea initially had vomiting but no vomiting since.  Denies diarrhea.  Denies dysuria but states she frequently has an urge to urinate.  No vaginal bleeding or discharge.  No fever.  Describes her abdominal pain is moderate and sharp in the left flank.   Past Medical History:  Diagnosis Date  . Asthma   . Thyroid disease     Patient Active Problem List   Diagnosis Date Noted  . Labor and delivery indication for care or intervention 09/13/2016    Past Surgical History:  Procedure Laterality Date  . APPENDECTOMY    . CHOLECYSTECTOMY    . DILATION AND CURETTAGE OF UTERUS    . KNEE SURGERY Right     Prior to Admission medications   Medication Sig Start Date End Date Taking? Authorizing Provider  acetaminophen (TYLENOL) 500 MG tablet Take 1,000 mg by mouth every 6 (six) hours as needed.    [provider]  albuterol (PROVENTIL HFA;VENTOLIN HFA) 108 (90 Base) MCG/ACT inhaler Inhale 2 puffs into the lungs every 6 (six) hours as needed for wheezing or shortness of breath. 08/14/16   Orvil Feil, PA-C  cyclobenzaprine (FLEXERIL) 10 MG tablet Take 1 tablet (10 mg total) by mouth 3 (three) times daily as needed for muscle spasms. 09/13/16   Conard Novak, MD  Prenatal Vit-Fe Fumarate-FA (MULTIVITAMIN-PRENATAL) 27-0.8 MG TABS tablet Take 1 tablet by mouth daily at 12 noon.    [provider]  famotidine (PEPCID) 40 MG tablet Take 1 tablet (40 mg total) by mouth every evening.  08/02/14 08/02/15  Rebecka Apley, MD  sucralfate (CARAFATE) 1 G tablet Take 1 tablet (1 g total) by mouth 4 (four) times daily. 08/02/14 08/02/15  Rebecka Apley, MD    Allergies  Allergen Reactions  . Flagyl [Metronidazole] Nausea Only  . Bactrim [Sulfamethoxazole-Trimethoprim] Rash  . Tape Rash  . Tramadol     Urinary retention    No family history on file.  Social History Social History   Tobacco Use  . Smoking status: Never Smoker  . Smokeless tobacco: Never Used  Substance Use Topics  . Alcohol use: No  . Drug use: No    Review of Systems Constitutional: Negative for fever. Eyes: Negative for visual complaints ENT: Negative for recent illness/congestion Cardiovascular: Negative for chest pain. Respiratory: Negative for shortness of breath. Gastrointestinal: Moderate left flank pain.  Positive for nausea, vomiting, negative for diarrhea Genitourinary: Urinary frequency/urge to urinate but no dysuria.  No hematuria. Musculoskeletal: Negative for musculoskeletal complaints Skin: Negative for skin complaints  Neurological: Negative for headache All other ROS negative  ____________________________________________   PHYSICAL EXAM:  VITAL SIGNS: ED Triage Vitals  Enc Vitals Group     BP 08/28/17 1403 127/84     Pulse Rate 08/28/17 1403 78     Resp 08/28/17 1403 18     Temp 08/28/17 1403 98 F (36.7 C)     Temp Source 08/28/17 1403 Oral     SpO2  08/28/17 1403 100 %     Weight 08/28/17 1404 225 lb (102.1 kg)     Height 08/28/17 1404 5\' 4"  (1.626 m)     Head Circumference --      Peak Flow --      Pain Score 08/28/17 1403 7     Pain Loc --      Pain Edu? --      Excl. in GC? --    Constitutional: Alert and oriented. Well appearing and in no distress. Eyes: Normal exam ENT   Head: Normocephalic and atraumatic.   Mouth/Throat: Mucous membranes are moist. Cardiovascular: Normal rate, regular rhythm. No murmur Respiratory: Normal respiratory  effort without tachypnea nor retractions. Breath sounds are clear Gastrointestinal: Soft, nontender abdomen, moderate left CVA tenderness. Musculoskeletal: Nontender with normal range of motion in all extremities.  Neurologic:  Normal speech and language. No gross focal neurologic deficits Skin:  Skin is warm, dry and intact.  Psychiatric: Mood and affect are normal.   ____________________________________________    RADIOLOGY  CT scan negative  ____________________________________________   INITIAL IMPRESSION / ASSESSMENT AND PLAN / ED COURSE  Pertinent labs & imaging results that were available during my care of the patient were reviewed by me and considered in my medical decision making (see chart for details).  Patient presents to the emergency department for moderate left flank pain.  No history of kidney stones personally have a strong family history in her father.  Patient's differential would include pyelonephritis, urinary tract infection, ureterolithiasis, colitis or diverticulitis.  Patient's labs are overall normal/reassuring.  Equivocal urinalysis, urine culture has been sent.  Pregnancy test is negative.  Given the patient's moderate sharp left flank pain will obtain CT imaging to further evaluate and help rule out ureterolithiasis.  Patient agreeable to plan of care.  CT scan is essentially negative.  We will discharge the patient with a short course of pain medication.  I discussed my normal chest pain return precautions.  ____________________________________________   FINAL CLINICAL IMPRESSION(S) / ED DIAGNOSES  Left flank pain    Minna AntisPaduchowski, Jyssica Rief, MD 08/28/17 1713

## 2017-08-29 LAB — URINE CULTURE

## 2017-09-13 IMAGING — CR DG KNEE COMPLETE 4+V*R*
4 series · 4 of 4 positions shown · non-contrast
Comparison: 10/26/2014

CLINICAL DATA: Right knee pain, jumped in a pool yesterday, heard a
pop, lateral and anterior pain

EXAM:
RIGHT KNEE - COMPLETE 4+ VIEW

[knee ap]
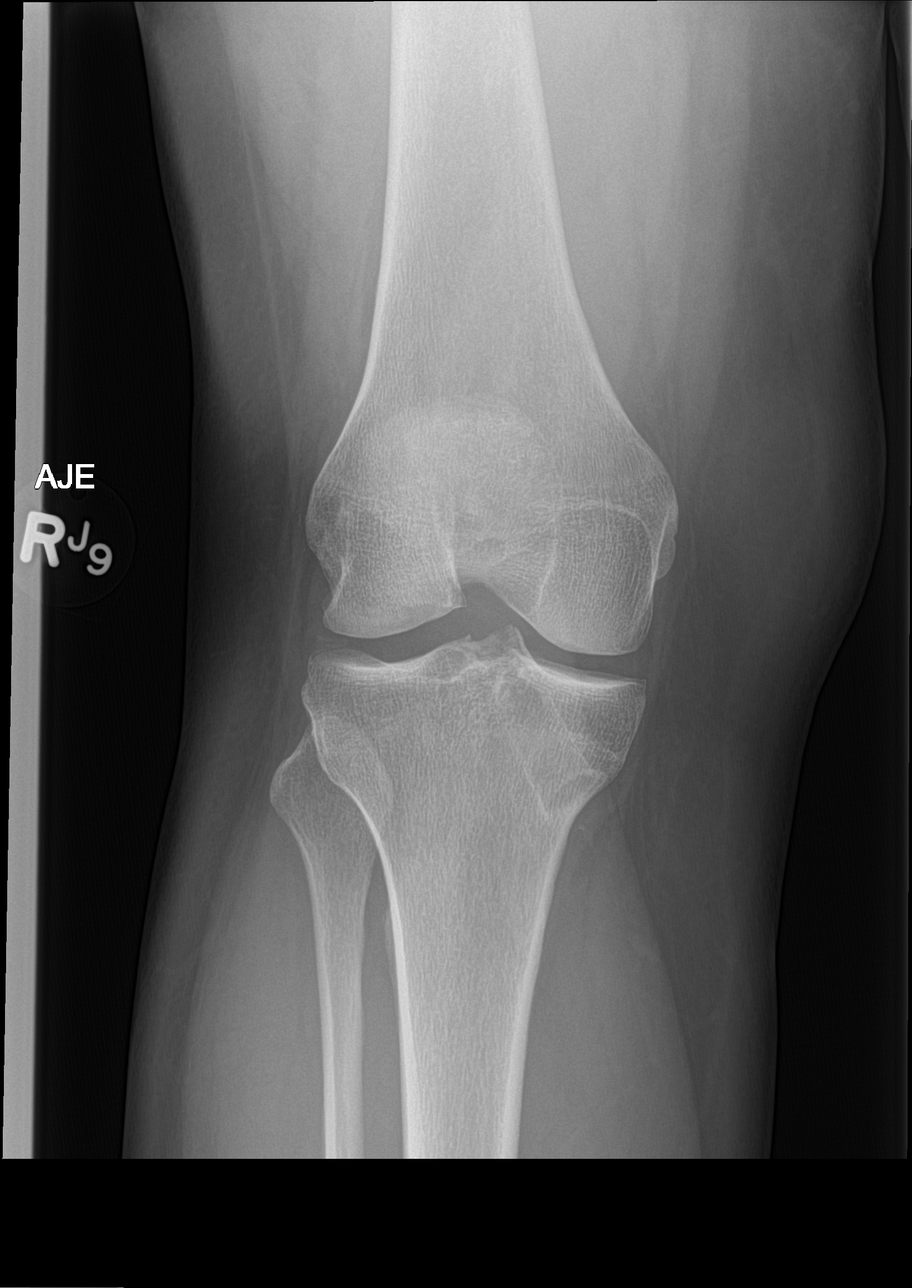

[knee obl (1 of 2)]
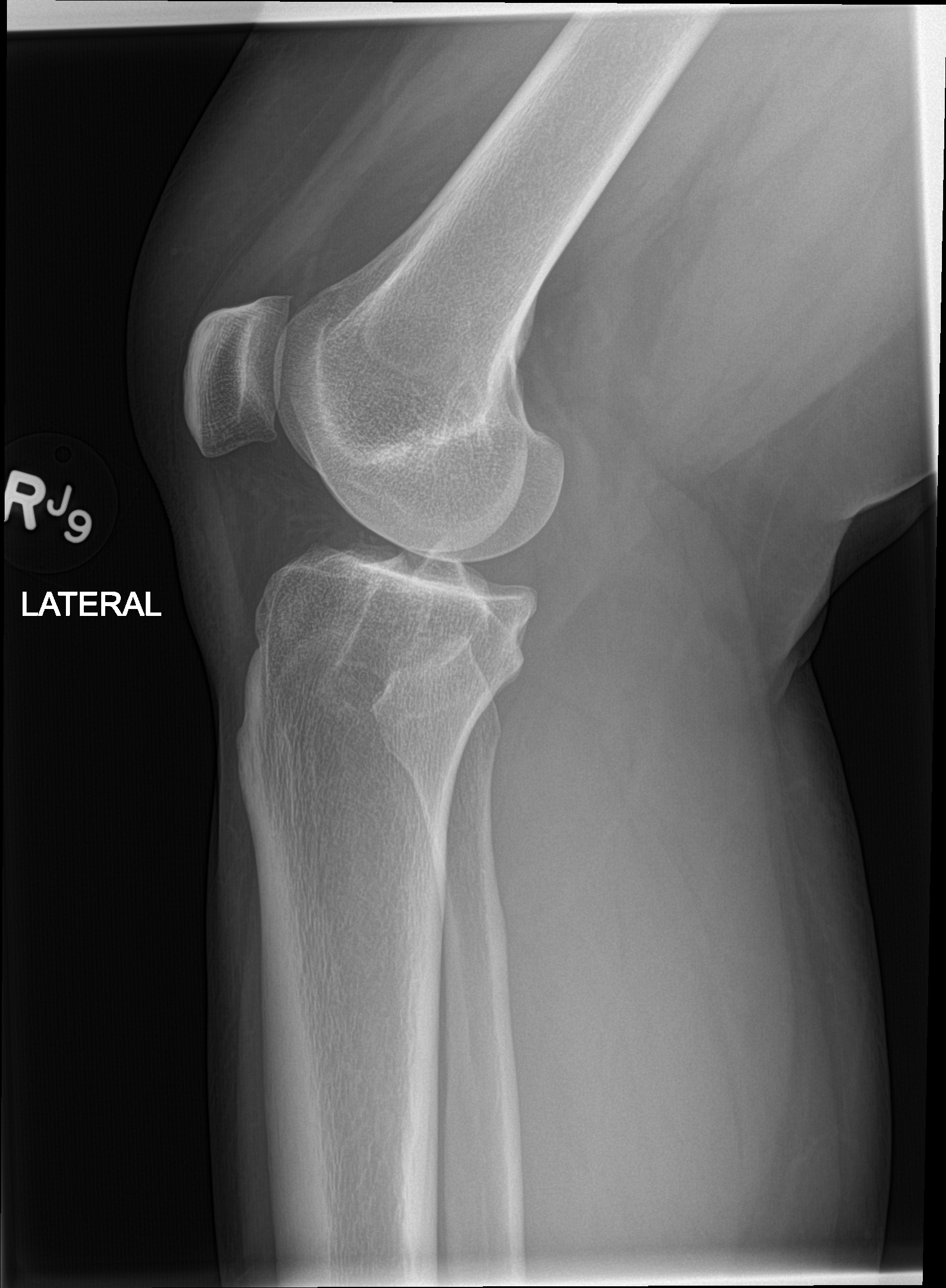

[knee obl (2 of 2)]
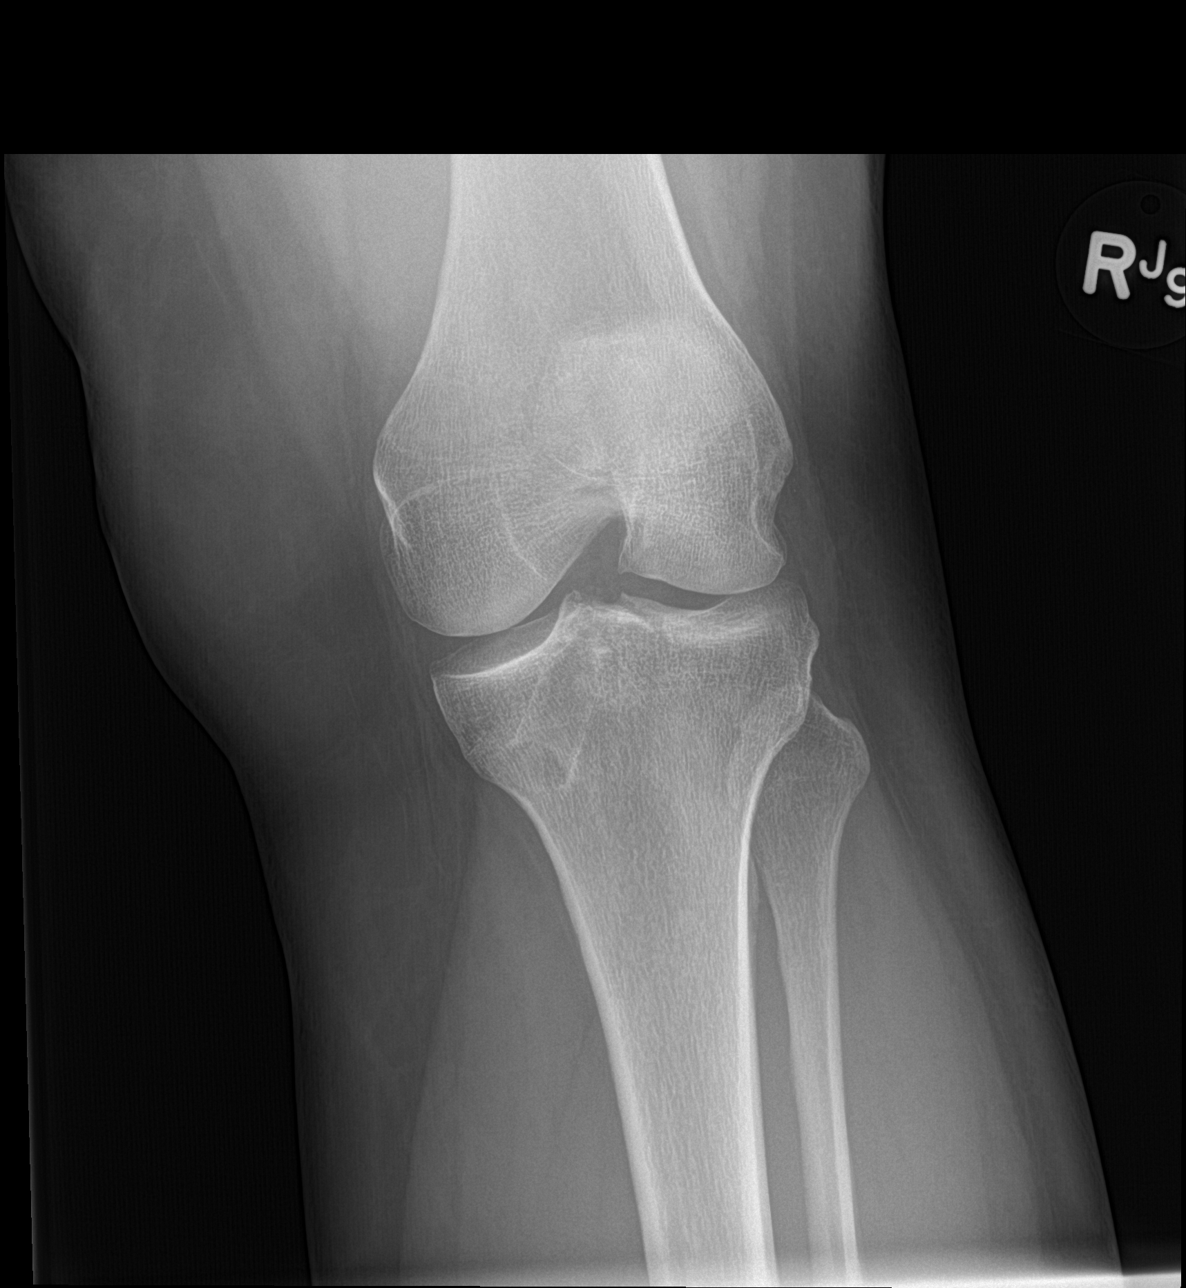

[sunrise]
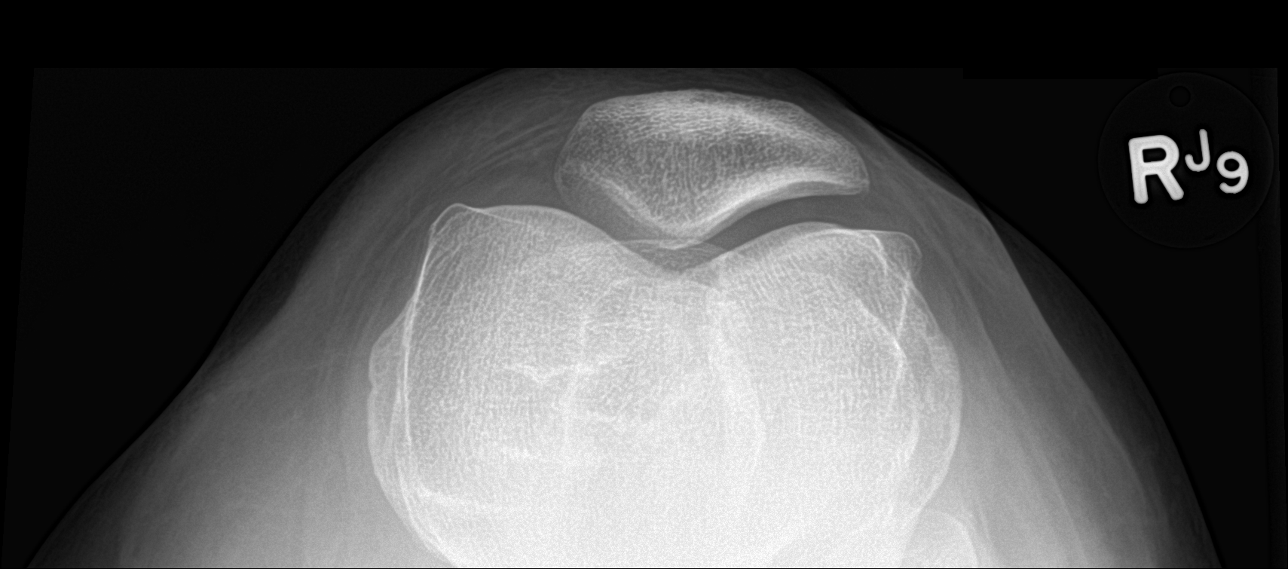

[4 of 4 positions shown; findings below may reference images not displayed]

FINDINGS: Four views of the right knee submitted. Again noticed postsurgical
changes post prior ACL repair. No acute fracture or subluxation. No
joint effusion.
IMPRESSION: Negative.

## 2017-12-23 ENCOUNTER — Emergency Department
Admission: EM | Admit: 2017-12-23 | Discharge: 2017-12-23 | Disposition: A | Discharge status: Home / Self Care | Payer: Medicaid Other | Attending: Emergency Medicine | Admitting: Emergency Medicine

## 2017-12-23 ENCOUNTER — Other Ambulatory Visit: Payer: Self-pay

## 2017-12-23 ENCOUNTER — Encounter: Payer: Self-pay | Admitting: Emergency Medicine

## 2017-12-23 DIAGNOSIS — J029 Acute pharyngitis, unspecified: Secondary | ICD-10-CM

## 2017-12-23 DIAGNOSIS — Z79899 Other long term (current) drug therapy: Secondary | ICD-10-CM | POA: Insufficient documentation

## 2017-12-23 DIAGNOSIS — E079 Disorder of thyroid, unspecified: Secondary | ICD-10-CM | POA: Insufficient documentation

## 2017-12-23 DIAGNOSIS — J00 Acute nasopharyngitis [common cold]: Secondary | ICD-10-CM

## 2017-12-23 DIAGNOSIS — J45909 Unspecified asthma, uncomplicated: Secondary | ICD-10-CM | POA: Insufficient documentation

## 2017-12-23 LAB — GROUP A STREP BY PCR: Group A Strep by PCR: NOT DETECTED

## 2017-12-23 MED ORDER — ACETAMINOPHEN 500 MG PO TABS
1000.0000 mg | ORAL_TABLET | Freq: Once | ORAL | Status: AC
  Administered 2017-12-23: 1000 mg via ORAL
  Filled 2017-12-23: qty 2

## 2017-12-23 MED ORDER — IBUPROFEN 600 MG PO TABS
600.0000 mg | ORAL_TABLET | Freq: Once | ORAL | Status: AC
  Administered 2017-12-23: 600 mg via ORAL
  Filled 2017-12-23: qty 1

## 2017-12-23 MED ORDER — DEXAMETHASONE 10 MG/ML FOR PEDIATRIC ORAL USE
10.0000 mg | Freq: Once | INTRAMUSCULAR | Status: AC
  Administered 2017-12-23: 10 mg via ORAL

## 2017-12-23 MED ORDER — DEXAMETHASONE SODIUM PHOSPHATE 10 MG/ML IJ SOLN
INTRAMUSCULAR | Status: AC
  Filled 2017-12-23: qty 1

## 2017-12-23 MED ORDER — OXYMETAZOLINE HCL 0.05 % NA SOLN
1.0000 | Freq: Once | NASAL | Status: AC
  Administered 2017-12-23: 1 via NASAL
  Filled 2017-12-23: qty 15

## 2017-12-23 NOTE — ED Triage Notes (Signed)
Patient ambulatory to triage with steady gait, without difficulty or distress noted; Pt reports sore throat, bilat earache, green prod cough x 3 days

## 2017-12-23 NOTE — ED Provider Notes (Signed)
[facility] Emergency Department Provider Note  ____________________________________________   First MD Initiated Contact with Patient 12/23/17 0214     (approximate)  I have reviewed the triage vital signs and the nursing notes.   HISTORY  Chief Complaint Sore Throat; Cough; and Otalgia   HPI Jessica Wong is a 32 y.o. female who self presents to the emergency department with sore throat, bilateral ear pain, and headache for the past 3 days.  She denies fever but she reports subjective chills at home.  Her primary concern right now is the sore throat that is sharp and aching moderate severity worse when swallowing.  I appreciate the nursing note stating a productive green cough however to me the patient denies any cough whatsoever.  She does also report significant nasal congestion.   She feels like she has somewhat decreased hearing.  Nothing seems to make her symptoms better or worse.   Past Medical History:  Diagnosis Date  . Asthma   . Thyroid disease     Patient Active Problem List   Diagnosis Date Noted  . Labor and delivery indication for care or intervention 09/13/2016    Past Surgical History:  Procedure Laterality Date  . APPENDECTOMY    . CHOLECYSTECTOMY    . DILATION AND CURETTAGE OF UTERUS    . KNEE SURGERY Right     Prior to Admission medications   Medication Sig Start Date End Date Taking? Authorizing Provider  acetaminophen (TYLENOL) 500 MG tablet Take 1,000 mg by mouth every 6 (six) hours as needed.    [provider]  albuterol (PROVENTIL HFA;VENTOLIN HFA) 108 (90 Base) MCG/ACT inhaler Inhale 2 puffs into the lungs every 6 (six) hours as needed for wheezing or shortness of breath. 08/14/16   Orvil Feil, PA-C  cyclobenzaprine (FLEXERIL) 10 MG tablet Take 1 tablet (10 mg total) by mouth 3 (three) times daily as needed for muscle spasms. 09/13/16   Conard Novak, MD  HYDROcodone-acetaminophen (NORCO/VICODIN) 5-325  MG tablet Take 1 tablet by mouth every 4 (four) hours as needed. 08/28/17   Minna Antis, MD  Prenatal Vit-Fe Fumarate-FA (MULTIVITAMIN-PRENATAL) 27-0.8 MG TABS tablet Take 1 tablet by mouth daily at 12 noon.    [provider]    Allergies Flagyl [metronidazole]; Bactrim [sulfamethoxazole-trimethoprim]; Tape; and Tramadol  No family history on file.  Social History Social History   Tobacco Use  . Smoking status: Never Smoker  . Smokeless tobacco: Never Used  Substance Use Topics  . Alcohol use: No  . Drug use: No    Review of Systems Constitutional: Positive for chills Eyes: No visual changes. ENT: Positive for sore throat. Cardiovascular: Denies chest pain. Respiratory: Denies shortness of breath. Gastrointestinal: No abdominal pain.  No nausea, no vomiting.   Genitourinary: Negative for dysuria. Musculoskeletal: Negative for back pain. Skin: Negative for rash. Neurological: Positive for headache  ____________________________________________   PHYSICAL EXAM:  VITAL SIGNS: ED Triage Vitals [12/23/17 0211]  Enc Vitals Group     BP      Pulse      Resp      Temp      Temp src      SpO2      Weight 230 lb (104.3 kg)     Height 5\' 4"  (1.626 m)     Head Circumference      Peak Flow      Pain Score 7     Pain Loc  Pain Edu?      Excl. in GC?     Constitutional: Alert and oriented x4 appears uncomfortable speaking with hoarse voice nontoxic no diaphoresis Eyes: PERRL EOMI. Head: Atraumatic.  Bilateral tympanic membranes with some fluid but no bulging or erythema Nose: No congestion/rhinnorhea. Mouth/Throat: No trismus uvula midline she does have bilateral pharyngeal exudative pharyngitis Neck: No stridor.   Cardiovascular: Normal rate, regular rhythm. Grossly normal heart sounds.  Good peripheral circulation. Respiratory: Normal respiratory effort.  No retractions. Lungs CTAB and moving good air Musculoskeletal: No lower extremity edema     Neurologic:  Normal speech and language. No gross focal neurologic deficits are appreciated. Skin:  Skin is warm, dry and intact. No rash noted. Psychiatric: Mood and affect are normal. Speech and behavior are normal.    ____________________________________________   DIFFERENTIAL includes but not limited to  Viral pharyngitis, mononucleosis, pneumonia, strep pharyngitis, otitis media ____________________________________________   LABS (all labs ordered are listed, but only abnormal results are displayed)  Labs Reviewed  GROUP A STREP BY PCR    Strep test reviewed by me is negative __________________________________________  EKG   ____________________________________________  RADIOLOGY   ____________________________________________   PROCEDURES  Procedure(s) performed: no  Procedures  Critical Care performed: no  ____________________________________________   INITIAL IMPRESSION / ASSESSMENT AND PLAN / ED COURSE  Pertinent labs & imaging results that were available during my care of the patient were reviewed by me and considered in my medical decision making (see chart for details).   As part of my medical decision making, I reviewed the following data within the electronic MEDICAL RECORD NUMBER History obtained from family if available, nursing notes, old chart and ekg, as well as notes from prior ED visits.      The patient is a centor 3 so we'll check a strep test prior to treating with antibiotics.  Decadron, ibuprofen, and tylenol are pending.  __----------------------------------------- 4:00 AM on 12/23/2017 -----------------------------------------  She feels significantly improved after treatment.  Strep is negative so will defer antibiotics.  She also reports some congestion so we will treat her with Afrin and strict return precautions have been given.  __________________________________________   FINAL CLINICAL IMPRESSION(S) / ED DIAGNOSES  Final  diagnoses:  Head cold  Viral pharyngitis      NEW MEDICATIONS STARTED DURING THIS VISIT:  New Prescriptions   No medications on file     Note:  This document was prepared using Dragon voice recognition software and may include unintentional dictation errors.     Merrily Brittle, MD 12/23/17 0400

## 2017-12-23 NOTE — Discharge Instructions (Signed)
Is normal for you to be sick for another 2 to 3 days with this cold.  Fortunately your strep test was negative and you do not need antibiotics.  It safe to take 600 mg of ibuprofen and 1000 mg of Tylenol 3-4 times a day to help with your symptoms.  Please follow-up with your primary care physician as needed and return to the emergency department for any concerns.  It was a pleasure to take care of you today, and thank you for coming to our emergency department.  If you have any questions or concerns before leaving please ask the nurse to grab me and I'm more than happy to go through your aftercare instructions again.  If you have any concerns once you are home that you are not improving or are in fact getting worse before you can make it to your follow-up appointment, please do not hesitate to call [phone] and come back for further evaluation.  Merrily Brittle, MD  Results for orders placed or performed during the hospital encounter of 12/23/17  Group A Strep by PCR  Result Value Ref Range   Group A Strep by PCR NOT DETECTED NOT DETECTED

## 2018-03-21 ENCOUNTER — Emergency Department: Payer: Self-pay

## 2018-03-21 ENCOUNTER — Other Ambulatory Visit: Payer: Self-pay

## 2018-03-21 ENCOUNTER — Encounter: Payer: Self-pay | Admitting: Emergency Medicine

## 2018-03-21 ENCOUNTER — Emergency Department
Admission: EM | Admit: 2018-03-21 | Discharge: 2018-03-21 | Disposition: A | Discharge status: Home / Self Care | Payer: Self-pay | Attending: Emergency Medicine | Admitting: Emergency Medicine

## 2018-03-21 DIAGNOSIS — J45901 Unspecified asthma with (acute) exacerbation: Secondary | ICD-10-CM | POA: Insufficient documentation

## 2018-03-21 DIAGNOSIS — J069 Acute upper respiratory infection, unspecified: Secondary | ICD-10-CM | POA: Insufficient documentation

## 2018-03-21 DIAGNOSIS — B9789 Other viral agents as the cause of diseases classified elsewhere: Secondary | ICD-10-CM

## 2018-03-21 MED ORDER — IPRATROPIUM-ALBUTEROL 0.5-2.5 (3) MG/3ML IN SOLN
3.0000 mL | Freq: Once | RESPIRATORY_TRACT | Status: AC
  Administered 2018-03-21: 3 mL via RESPIRATORY_TRACT
  Filled 2018-03-21: qty 3

## 2018-03-21 MED ORDER — ALBUTEROL SULFATE (2.5 MG/3ML) 0.083% IN NEBU
5.0000 mg | INHALATION_SOLUTION | Freq: Once | RESPIRATORY_TRACT | Status: AC
  Administered 2018-03-21: 5 mg via RESPIRATORY_TRACT
  Filled 2018-03-21: qty 6

## 2018-03-21 MED ORDER — PREDNISONE 20 MG PO TABS
60.0000 mg | ORAL_TABLET | Freq: Once | ORAL | Status: AC
  Administered 2018-03-21: 60 mg via ORAL
  Filled 2018-03-21: qty 3

## 2018-03-21 MED ORDER — ALBUTEROL SULFATE (2.5 MG/3ML) 0.083% IN NEBU: 2.5000 mg | INHALATION_SOLUTION | Freq: Four times a day (QID) | RESPIRATORY_TRACT | 0 refills | Status: DC | PRN

## 2018-03-21 MED ORDER — PREDNISONE 50 MG PO TABS: 50.0000 mg | ORAL_TABLET | Freq: Every day | ORAL | 0 refills | Status: AC

## 2018-03-21 NOTE — Discharge Instructions (Signed)
It was a pleasure to take care of you today, and thank you for coming to our emergency department.  If you have any questions or concerns before leaving please ask the nurse to grab me and I'm more than happy to go through your aftercare instructions again.  If you were prescribed any opioid pain medication today such as Norco, Vicodin, Percocet, morphine, hydrocodone, or oxycodone please make sure you do not drive when you are taking this medication as it can alter your ability to drive safely.  If you have any concerns once you are home that you are not improving or are in fact getting worse before you can make it to your follow-up appointment, please do not hesitate to call 911 and come back for further evaluation.  Merrily Brittle, MD  Results for orders placed or performed during the hospital encounter of 12/23/17  Group A Strep by PCR  Result Value Ref Range   Group A Strep by PCR NOT DETECTED NOT DETECTED   Dg Chest 2 View  Result Date: 03/21/2018 CLINICAL DATA:  Dry cough and asthma exacerbation. Chest pain. Wheezes. EXAM: CHEST - 2 VIEW COMPARISON:  08/14/2016 FINDINGS: The heart size and mediastinal contours are within normal limits. Both lungs are clear. The visualized skeletal structures are unremarkable. IMPRESSION: No active cardiopulmonary disease. Electronically Signed   By: Burman Nieves M.D.   On: 03/21/2018 03:13

## 2018-03-21 NOTE — ED Triage Notes (Signed)
Pt presents to ER from home with complaints of dry cough and asthma exacerbation, reports she has been using her inhaler but not getting any relief. Pt talks in complete sentences, expiratory wheezing

## 2018-03-21 NOTE — ED Notes (Signed)
Patient transported to X-ray 

## 2018-03-21 NOTE — ED Provider Notes (Signed)
[facility] Emergency Department Provider Note  ____________________________________________   First MD Initiated Contact with Patient 03/21/18 [phone]     (approximate)  I have reviewed the triage vital signs and the nursing notes.   HISTORY  Chief Complaint Asthma and Cough   HPI Jessica Wong is a 33 y.o. female with a past medical history of asthma comes to the emergency department with gradual onset slowly progressive cough and shortness of breath for the past 2 days or so.  She has had upper respiratory symptoms and a dry cough.  No fevers or chills.  She is used her inhaler at home and she is out of her nebulizer.  The albuterol at home has given her minimal to no relief.  Her symptoms are now moderate severity.  They are worse with exertion and improved with rest.    Past Medical History:  Diagnosis Date  . Asthma   . Thyroid disease     Patient Active Problem List   Diagnosis Date Noted  . Labor and delivery indication for care or intervention 09/13/2016    Past Surgical History:  Procedure Laterality Date  . APPENDECTOMY    . CHOLECYSTECTOMY    . DILATION AND CURETTAGE OF UTERUS    . KNEE SURGERY Right     Prior to Admission medications   Medication Sig Start Date End Date Taking? Authorizing Provider  acetaminophen (TYLENOL) 500 MG tablet Take 1,000 mg by mouth every 6 (six) hours as needed.    [provider]  albuterol (PROVENTIL) (2.5 MG/3ML) 0.083% nebulizer solution Take 3 mLs (2.5 mg total) by nebulization every 6 (six) hours as needed for wheezing or shortness of breath. 03/21/18   Merrily Brittleifenbark, Salome Hautala, MD  cyclobenzaprine (FLEXERIL) 10 MG tablet Take 1 tablet (10 mg total) by mouth 3 (three) times daily as needed for muscle spasms. 09/13/16   Conard NovakJackson, Stephen D, MD  HYDROcodone-acetaminophen (NORCO/VICODIN) 5-325 MG tablet Take 1 tablet by mouth every 4 (four) hours as needed. 08/28/17   Minna AntisPaduchowski, Kevin, MD  Prenatal Vit-Fe  Fumarate-FA (MULTIVITAMIN-PRENATAL) 27-0.8 MG TABS tablet Take 1 tablet by mouth daily at 12 noon.    [provider]    Allergies Flagyl [metronidazole]; Bactrim [sulfamethoxazole-trimethoprim]; Tape; and Tramadol  No family history on file.  Social History Social History   Tobacco Use  . Smoking status: Never Smoker  . Smokeless tobacco: Never Used  Substance Use Topics  . Alcohol use: No  . Drug use: No    Review of Systems Constitutional: No fever/chills Eyes: No visual changes. ENT: Positive for rhinorrhea Cardiovascular: Positive for chest pain. Respiratory: Positive for shortness of breath. Gastrointestinal: No abdominal pain.  No nausea, no vomiting.  No diarrhea.  No constipation. Genitourinary: Negative for dysuria. Musculoskeletal: Negative for back pain. Skin: Negative for rash. Neurological: Negative for headaches, focal weakness or numbness.   ____________________________________________   PHYSICAL EXAM:  VITAL SIGNS: ED Triage Vitals  Enc Vitals Group     BP 03/21/18 0239 139/88     Pulse Rate 03/21/18 0239 79     Resp 03/21/18 0239 (!) 22     Temp 03/21/18 0239 98 F (36.7 C)     Temp Source 03/21/18 0239 Oral     SpO2 03/21/18 0239 100 %     Weight 03/21/18 0240 230 lb (104.3 kg)     Height 03/21/18 0240 5\' 4"  (1.626 m)     Head Circumference --      Peak Flow --  Pain Score 03/21/18 0239 5     Pain Loc --      Pain Edu? --      Excl. in GC? --     Constitutional: Alert and oriented x4 appears obviously somewhat short of breath although not using accessory muscles Eyes: PERRL EOMI. Head: Atraumatic. Nose: No congestion/rhinnorhea. Mouth/Throat: No trismus Neck: No stridor.  Able to lie completely flat no JVD Cardiovascular: Normal rate, regular rhythm. Grossly normal heart sounds.  Good peripheral circulation. Respiratory: Increased respiratory effort with prolonged expiratory phase and expiratory wheezing in all fields  although moving good air no rhonchi noted Gastrointestinal: Soft nontender Musculoskeletal: No lower extremity edema legs are equal in size Neurologic:  Normal speech and language. No gross focal neurologic deficits are appreciated. Skin:  Skin is warm, dry and intact. No rash noted. Psychiatric: Mood and affect are normal. Speech and behavior are normal.    ____________________________________________   DIFFERENTIAL includes but not limited to  Asthma exacerbation, influenza, pneumonia, pulmonary embolism ____________________________________________   LABS (all labs ordered are listed, but only abnormal results are displayed)  Labs Reviewed - No data to display   __________________________________________  EKG   ____________________________________________  RADIOLOGY  Chest x-ray reviewed by me with no acute disease ____________________________________________   PROCEDURES  Procedure(s) performed: no  Procedures  Critical Care performed: no  ____________________________________________   INITIAL IMPRESSION / ASSESSMENT AND PLAN / ED COURSE  Pertinent labs & imaging results that were available during my care of the patient were reviewed by me and considered in my medical decision making (see chart for details).   As part of my medical decision making, I reviewed the following data within the electronic MEDICAL RECORD NUMBER History obtained from family if available, nursing notes, old chart and ekg, as well as notes from prior ED visits.  The patient comes to the emergency department with elevated respiratory rate and prolonged expiratory phase with wheezing throughout consistent with acute asthma exacerbation.  Chest x-ray obtained in triage which is negative for consolidation.  Patient was given 3 duo nebs and steroids with improvement in her symptoms.  I will give her 4 more days of steroids for home and refill her nebulizer albuterol.  Strict return precautions have  been given.      ____________________________________________   FINAL CLINICAL IMPRESSION(S) / ED DIAGNOSES  Final diagnoses:  Moderate asthma with exacerbation, unspecified whether persistent  Viral URI with cough      NEW MEDICATIONS STARTED DURING THIS VISIT:  Discharge Medication List as of 03/21/2018  6:36 AM    START taking these medications   Details  albuterol (PROVENTIL) (2.5 MG/3ML) 0.083% nebulizer solution Take 3 mLs (2.5 mg total) by nebulization every 6 (six) hours as needed for wheezing or shortness of breath., Starting Sat 03/21/2018, Print    predniSONE (DELTASONE) 50 MG tablet Take 1 tablet (50 mg total) by mouth daily for 4 days., Starting Sat 03/21/2018, Until Wed 03/25/2018, Print         Note:  This document was prepared using Dragon voice recognition software and may include unintentional dictation errors.    Merrily Brittleifenbark, Nekia Maxham, MD 03/27/18 2229

## 2018-04-04 IMAGING — CR DG CHEST 2V
2 series · 2 of 2 positions shown · non-contrast
Comparison: Prior radiograph from 02/13/2016.

CLINICAL DATA: Initial evaluation for acute shortness of breath.

EXAM:
CHEST  2 VIEW

[chest pa]
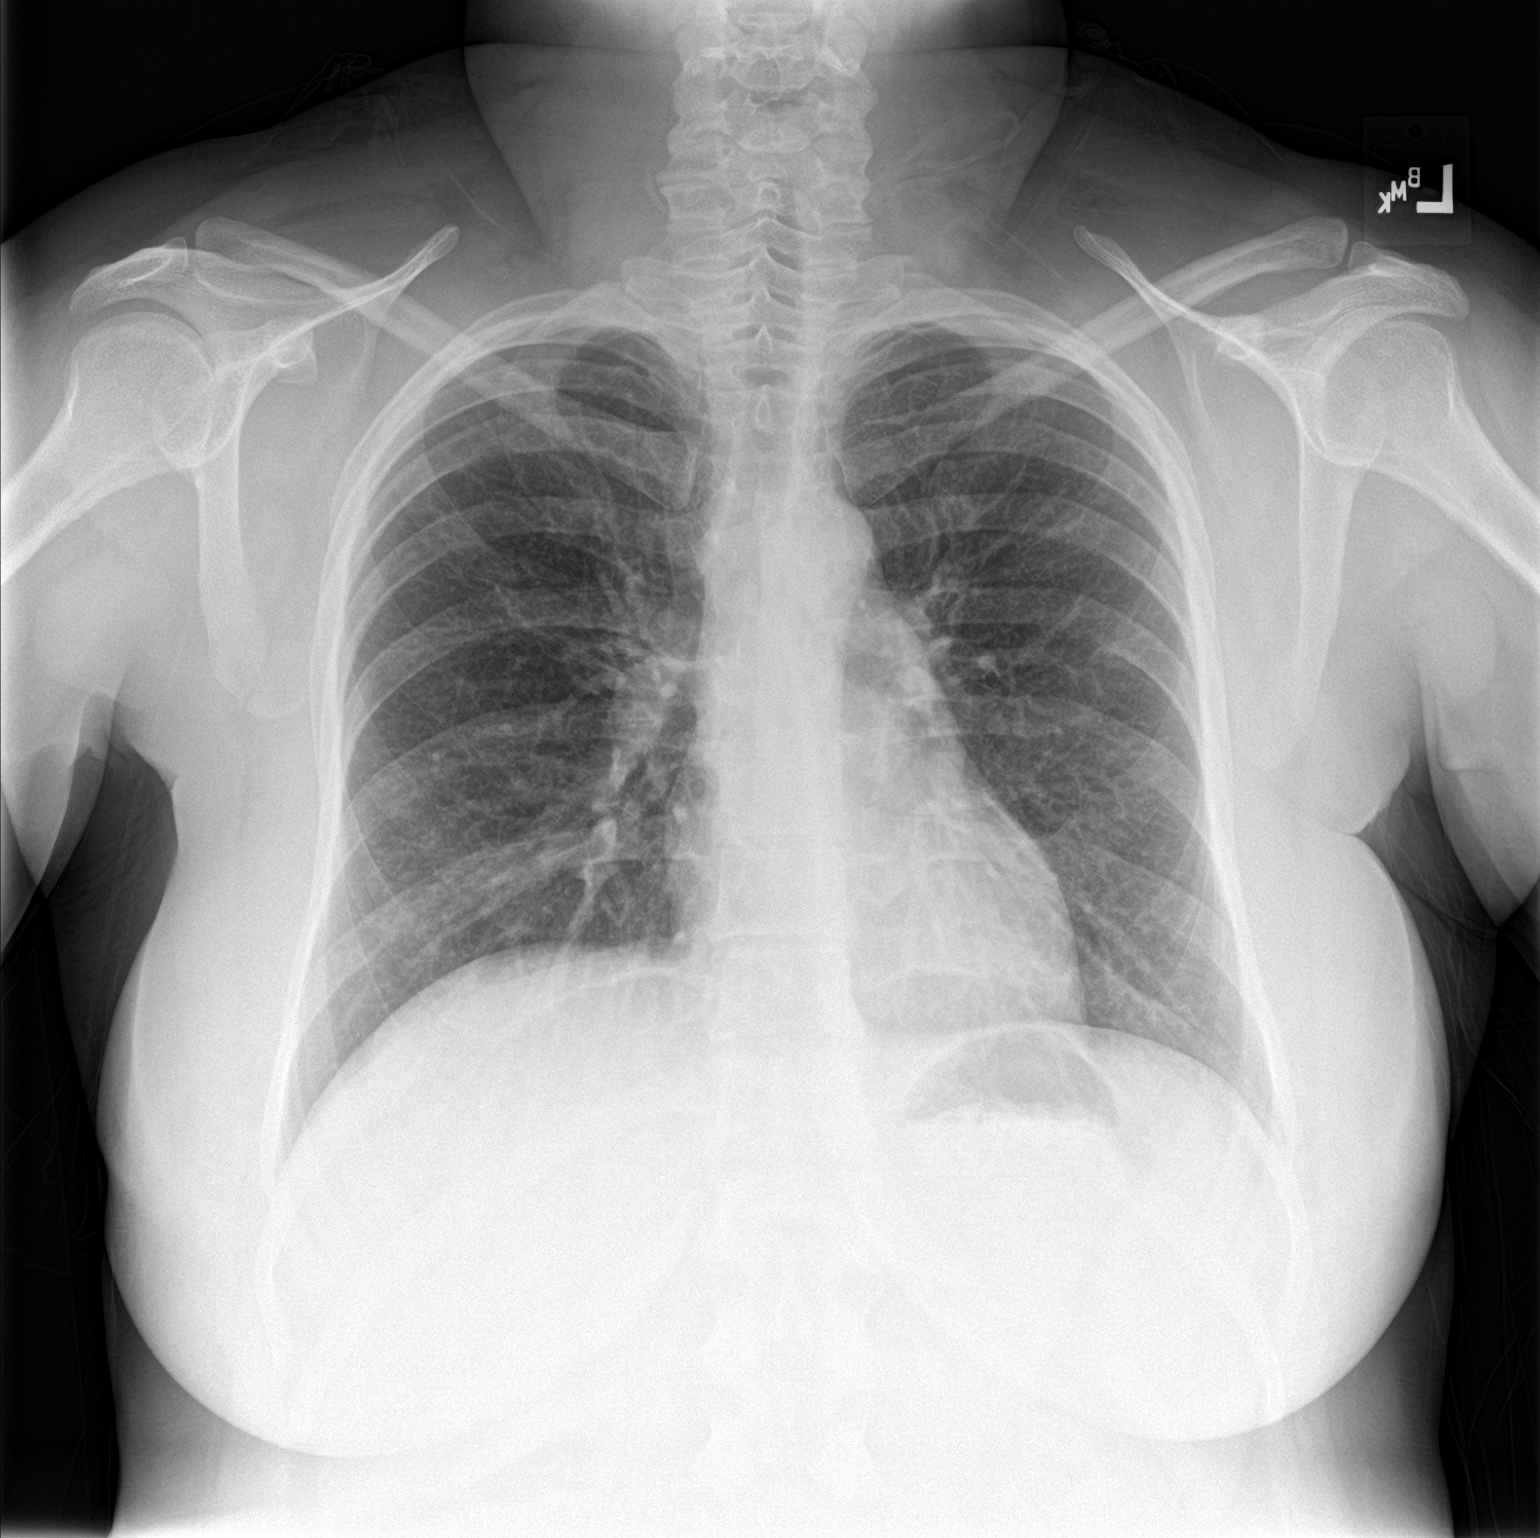

[chest lat]
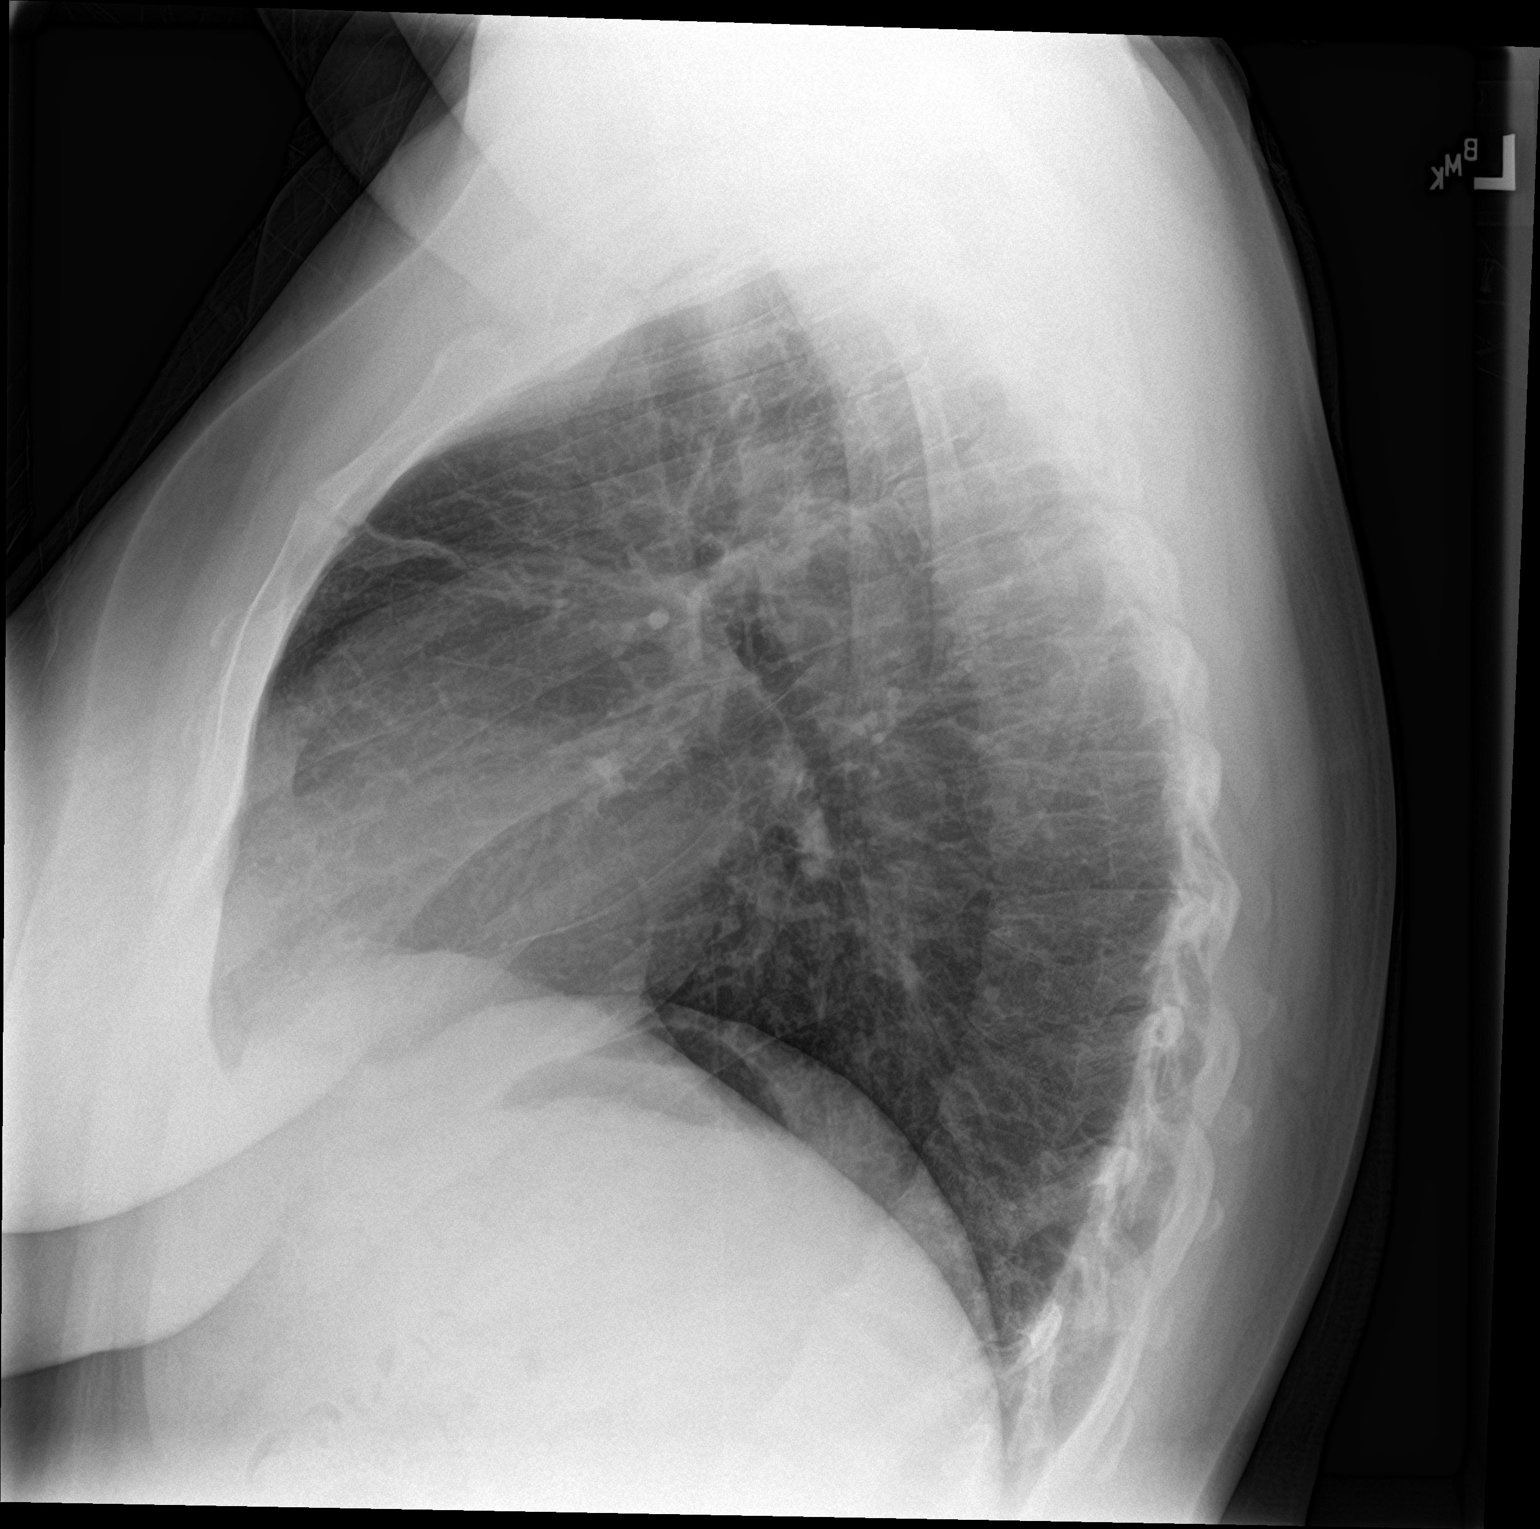

[2 of 2 positions shown; findings below may reference images not displayed]

FINDINGS: Cardiac and mediastinal silhouettes are stable in size and contour,
and remain within normal limits.

Lungs normally inflated. No focal infiltrates. No pulmonary edema or
pleural effusion. No pneumothorax. Mild scarring noted within the
right upper lobe.

No acute osseous abnormality.
IMPRESSION: 1. No active cardiopulmonary disease.
2. Mild atelectasis/scarring within the right upper lobe.

## 2018-04-05 ENCOUNTER — Encounter: Payer: Self-pay | Admitting: Emergency Medicine

## 2018-04-05 ENCOUNTER — Emergency Department
Admission: EM | Admit: 2018-04-05 | Discharge: 2018-04-05 | Disposition: A | Discharge status: Home / Self Care | Payer: Self-pay | Attending: Student in an Organized Health Care Education/Training Program | Admitting: Student in an Organized Health Care Education/Training Program

## 2018-04-05 ENCOUNTER — Emergency Department: Payer: Self-pay

## 2018-04-05 ENCOUNTER — Other Ambulatory Visit: Payer: Self-pay

## 2018-04-05 DIAGNOSIS — Y998 Other external cause status: Secondary | ICD-10-CM | POA: Insufficient documentation

## 2018-04-05 DIAGNOSIS — S46812A Strain of other muscles, fascia and tendons at shoulder and upper arm level, left arm, initial encounter: Secondary | ICD-10-CM

## 2018-04-05 DIAGNOSIS — S66912A Strain of unspecified muscle, fascia and tendon at wrist and hand level, left hand, initial encounter: Secondary | ICD-10-CM | POA: Insufficient documentation

## 2018-04-05 DIAGNOSIS — Y929 Unspecified place or not applicable: Secondary | ICD-10-CM | POA: Insufficient documentation

## 2018-04-05 DIAGNOSIS — W010XXA Fall on same level from slipping, tripping and stumbling without subsequent striking against object, initial encounter: Secondary | ICD-10-CM | POA: Insufficient documentation

## 2018-04-05 DIAGNOSIS — S29012A Strain of muscle and tendon of back wall of thorax, initial encounter: Secondary | ICD-10-CM | POA: Insufficient documentation

## 2018-04-05 DIAGNOSIS — S46912A Strain of unspecified muscle, fascia and tendon at shoulder and upper arm level, left arm, initial encounter: Secondary | ICD-10-CM | POA: Insufficient documentation

## 2018-04-05 DIAGNOSIS — J45909 Unspecified asthma, uncomplicated: Secondary | ICD-10-CM | POA: Insufficient documentation

## 2018-04-05 DIAGNOSIS — M5432 Sciatica, left side: Secondary | ICD-10-CM

## 2018-04-05 DIAGNOSIS — Y9366 Activity, soccer: Secondary | ICD-10-CM | POA: Insufficient documentation

## 2018-04-05 MED ORDER — CYCLOBENZAPRINE HCL 10 MG PO TABS: 10.0000 mg | ORAL_TABLET | Freq: Three times a day (TID) | ORAL | 0 refills | Status: DC | PRN

## 2018-04-05 NOTE — ED Provider Notes (Signed)
[facility] Emergency Department Provider Note ____________________________________________  Time seen: Approximately 7:25 AM  I have reviewed the triage vital signs and the nursing notes.   HISTORY  Chief Complaint Fall    HPI Jessica Wong is a 33 y.o. female who presents to the emergency department for evaluation and treatment of who presents to the emergency department for treatment and evaluation of left neck, shoulder, arm, and wrist pain after falling last night. She was outside playing soccer with her children yesterday evening and fell. She states that her feet flew out from under her and she landed awkwardly on her left side. She denies loss of consciousness or headache.    Past Medical History:  Diagnosis Date  . Asthma   . Thyroid disease     Patient Active Problem List   Diagnosis Date Noted  . Labor and delivery indication for care or intervention 09/13/2016    Past Surgical History:  Procedure Laterality Date  . APPENDECTOMY    . CHOLECYSTECTOMY    . DILATION AND CURETTAGE OF UTERUS    . KNEE SURGERY Right     Prior to Admission medications   Medication Sig Start Date End Date Taking? Authorizing Provider  acetaminophen (TYLENOL) 500 MG tablet Take 1,000 mg by mouth every 6 (six) hours as needed.    [provider]  albuterol (PROVENTIL) (2.5 MG/3ML) 0.083% nebulizer solution Take 3 mLs (2.5 mg total) by nebulization every 6 (six) hours as needed for wheezing or shortness of breath. 03/21/18   Merrily Brittle, MD  cyclobenzaprine (FLEXERIL) 10 MG tablet Take 1 tablet (10 mg total) by mouth 3 (three) times daily as needed for muscle spasms. 04/05/18   Burch Marchuk, Rulon Eisenmenger B, FNP  HYDROcodone-acetaminophen (NORCO/VICODIN) 5-325 MG tablet Take 1 tablet by mouth every 4 (four) hours as needed. 08/28/17   Minna Antis, MD  Prenatal Vit-Fe Fumarate-FA (MULTIVITAMIN-PRENATAL) 27-0.8 MG TABS tablet Take 1 tablet by mouth daily at 12  noon.    [provider]    Allergies Flagyl [metronidazole]; Bactrim [sulfamethoxazole-trimethoprim]; Tape; and Tramadol  History reviewed. No pertinent family history.  Social History Social History   Tobacco Use  . Smoking status: Never Smoker  . Smokeless tobacco: Never Used  Substance Use Topics  . Alcohol use: No  . Drug use: No    Review of Systems Constitutional: Negative for fever. Cardiovascular: Negative for chest pain. Respiratory: Negative for shortness of breath. Musculoskeletal: Positive for neck, left shoulder, left arm, and left wrist. Skin: Negative for open wounds  Neurological: Negative for decrease in sensation  ____________________________________________   PHYSICAL EXAM:  VITAL SIGNS: ED Triage Vitals  Enc Vitals Group     BP 04/05/18 0711 117/73     Pulse Rate 04/05/18 0711 69     Resp 04/05/18 0711 18     Temp 04/05/18 0711 97.9 F (36.6 C)     Temp Source 04/05/18 0711 Oral     SpO2 04/05/18 0711 97 %     Weight 04/05/18 0707 230 lb (104.3 kg)     Height 04/05/18 0707 5\' 4"  (1.626 m)     Head Circumference --      Peak Flow --      Pain Score 04/05/18 0707 8     Pain Loc --      Pain Edu? --      Excl. in GC? --     Constitutional: Alert and oriented. Well appearing and in no acute distress. Eyes: Conjunctivae  are clear without discharge or drainage Head: Atraumatic Neck: Tender over C7 and T1-2.  Respiratory: No cough. Respirations are even and unlabored. Musculoskeletal: Diffuse tenderness over the left shoulder, no step off or deformity. Diffuse tenderness over the elbow, forearm, and wrist. ROM extremely limited secondary to pain. Neurologic: Motor and sensory function is intact.  Skin: No open wounds or contusions  Psychiatric: Affect and behavior are appropriate.  ____________________________________________   LABS (all labs ordered are listed, but only abnormal results are displayed)  Labs Reviewed - No data  to display ____________________________________________  RADIOLOGY  Images of the thoracic spine, left shoulder, and left forearm are negative for acute bony abnormality per radiology. ____________________________________________   PROCEDURES  Procedures  ____________________________________________   INITIAL IMPRESSION / ASSESSMENT AND PLAN / ED COURSE  Jessica Wong is a 33 y.o. who presents to the emergency department for treatment and evaluation of pain post mechanical, non syncopal fall yesterday.  Images are reassuring. She will continue the NSAIDs and will receive a prescription for flexeril. She will also be given a note for work for 2 days.  Patient instructed to follow-up with her primary care provider if not improving over the next few days.  She was also instructed to return to the emergency department for symptoms that change or worsen if unable schedule an appointment with orthopedics or primary care.  Medications - No data to display  Pertinent labs & imaging results that were available during my care of the patient were reviewed by me and considered in my medical decision making (see chart for details).  _________________________________________   FINAL CLINICAL IMPRESSION(S) / ED DIAGNOSES  Final diagnoses:  Shoulder strain, left, initial encounter  Wrist strain, left, initial encounter  Trapezius muscle strain, left, initial encounter    ED Discharge Orders         Ordered    cyclobenzaprine (FLEXERIL) 10 MG tablet  3 times daily PRN     04/05/18 0819           If controlled substance prescribed during this visit, 12 month history viewed on the [place] prior to issuing an initial prescription for Schedule II or III opiod.    Chinita Pester, FNP 04/05/18 6861    Willy Eddy, MD 04/05/18 1028

## 2018-04-05 NOTE — ED Notes (Signed)
See triage note  Presents s/p fall yesterday    States her feet went out from under her  Unsure of how she landed  Having pain to left shoulder and neck  Limited ROM d/t pain

## 2018-04-05 NOTE — Discharge Instructions (Signed)
Continue the ibuprofen or meloxicam, but don't take both. Use the flexeril if needed, but be aware that it will make you sleepy.  Follow up with primary care if not improving over the week.   Return to the ER for symptoms that change or worsen if unable to schedule an appointment.

## 2018-04-05 NOTE — ED Triage Notes (Signed)
Pt c/o fall yesterday.  Was playing soccer with kids and fell on mostly left side of body. Pain to left shoulder and neck mostly.  Worse with movement.

## 2018-05-05 ENCOUNTER — Other Ambulatory Visit: Payer: Self-pay

## 2018-05-05 ENCOUNTER — Emergency Department
Admission: EM | Admit: 2018-05-05 | Discharge: 2018-05-05 | Disposition: A | Discharge status: Home / Self Care | Payer: Self-pay | Attending: Emergency Medicine | Admitting: Emergency Medicine

## 2018-05-05 ENCOUNTER — Encounter: Payer: Self-pay | Admitting: Emergency Medicine

## 2018-05-05 DIAGNOSIS — Z79899 Other long term (current) drug therapy: Secondary | ICD-10-CM | POA: Insufficient documentation

## 2018-05-05 DIAGNOSIS — L03115 Cellulitis of right lower limb: Secondary | ICD-10-CM | POA: Insufficient documentation

## 2018-05-05 DIAGNOSIS — J45909 Unspecified asthma, uncomplicated: Secondary | ICD-10-CM | POA: Insufficient documentation

## 2018-05-05 MED ORDER — CLINDAMYCIN HCL 150 MG PO CAPS
300.0000 mg | ORAL_CAPSULE | Freq: Once | ORAL | Status: AC
  Administered 2018-05-05: 300 mg via ORAL

## 2018-05-05 MED ORDER — CLINDAMYCIN HCL 300 MG PO CAPS: 300.0000 mg | ORAL_CAPSULE | Freq: Four times a day (QID) | ORAL | 0 refills | Status: DC

## 2018-05-05 NOTE — ED Notes (Signed)
Pt with red and swollen area to inside left thigh. Pt denies fever.

## 2018-05-05 NOTE — ED Triage Notes (Signed)
Patient ambulatory to triage with steady gait, without difficulty or distress noted; st ?spider bite noted 3 days ago to right inner thigh; area reddened

## 2018-05-05 NOTE — ED Provider Notes (Signed)
[facility] Emergency Department Provider Note  ____________________________________________  Time seen: Approximately 9:29 PM  I have reviewed the triage vital signs and the nursing notes.   HISTORY  Chief Complaint Insect Bite    HPI Jessica Wong is a 33 y.o. female who presents the emergency department complaining of erythematous and edematous skin findings to the right thigh.   Patient reports that approximately 3 days ago she felt a "bite" to the right medial thigh.  Patient reports that at that time she did not think anything of it.  Patient noticed approximately 12 hours later that she had a "pimple" looking skin finding to the right thigh.  No surrounding erythema or edema at the time.  Patient reports that area has slightly become more erythematous and edematous given the increasing nature she presents to have it evaluated.  She has had previous cellulitis but states that she does not have any reoccurring skin lesions.  Patient denies any drainage from the site.  Area is moderately tender to palpation.  No fevers or chills, domino pain, nausea or vomiting.  No medications prior to arrival.   Past Medical History:  Diagnosis Date  . Asthma   . Thyroid disease     Patient Active Problem List   Diagnosis Date Noted  . Labor and delivery indication for care or intervention 09/13/2016    Past Surgical History:  Procedure Laterality Date  . APPENDECTOMY    . CHOLECYSTECTOMY    . DILATION AND CURETTAGE OF UTERUS    . KNEE SURGERY Right     Prior to Admission medications   Medication Sig Start Date End Date Taking? Authorizing Provider  acetaminophen (TYLENOL) 500 MG tablet Take 1,000 mg by mouth every 6 (six) hours as needed.    [provider]  albuterol (PROVENTIL) (2.5 MG/3ML) 0.083% nebulizer solution Take 3 mLs (2.5 mg total) by nebulization every 6 (six) hours as needed for wheezing or shortness of breath. 03/21/18   Merrily Brittle,  MD  clindamycin (CLEOCIN) 300 MG capsule Take 1 capsule (300 mg total) by mouth 4 (four) times daily. 05/05/18   Cuthriell, Delorise Royals, PA-C  cyclobenzaprine (FLEXERIL) 10 MG tablet Take 1 tablet (10 mg total) by mouth 3 (three) times daily as needed for muscle spasms. 04/05/18   Triplett, Rulon Eisenmenger B, FNP  HYDROcodone-acetaminophen (NORCO/VICODIN) 5-325 MG tablet Take 1 tablet by mouth every 4 (four) hours as needed. 08/28/17   Minna Antis, MD  Prenatal Vit-Fe Fumarate-FA (MULTIVITAMIN-PRENATAL) 27-0.8 MG TABS tablet Take 1 tablet by mouth daily at 12 noon.    [provider]    Allergies Flagyl [metronidazole]; Bactrim [sulfamethoxazole-trimethoprim]; Tape; and Tramadol  No family history on file.  Social History Social History   Tobacco Use  . Smoking status: Never Smoker  . Smokeless tobacco: Never Used  Substance Use Topics  . Alcohol use: No  . Drug use: No     Review of Systems  Constitutional: No fever/chills Eyes: No visual changes. Cardiovascular: no chest pain. Respiratory: no cough. No SOB. Gastrointestinal: No abdominal pain.  No nausea, no vomiting.   Musculoskeletal: Negative for musculoskeletal pain. Skin: Positive for erythematous skin lesion to the right thigh. Neurological: Negative for headaches, focal weakness or numbness. 10-point ROS otherwise negative.  ____________________________________________   PHYSICAL EXAM:  VITAL SIGNS: ED Triage Vitals  Enc Vitals Group     BP 05/05/18 2110 133/72     Pulse Rate 05/05/18 2110 86     Resp 05/05/18  2110 16     Temp 05/05/18 2110 98.7 F (37.1 C)     Temp Source 05/05/18 2110 Oral     SpO2 05/05/18 2110 99 %     Weight 05/05/18 2112 230 lb (104.3 kg)     Height 05/05/18 2112 5\' 4"  (1.626 m)     Head Circumference --      Peak Flow --      Pain Score 05/05/18 2120 5     Pain Loc --      Pain Edu? --      Excl. in GC? --      Constitutional: Alert and oriented. Well appearing and in no  acute distress. Eyes: Conjunctivae are normal. PERRL. EOMI. Head: Atraumatic. Neck: No stridor.   Cardiovascular: Normal rate, regular rhythm. Normal S1 and S2.  Good peripheral circulation. Respiratory: Normal respiratory effort without tachypnea or retractions. Lungs CTAB. Good air entry to the bases with no decreased or absent breath sounds. Musculoskeletal: Full range of motion to all extremities. No gross deformities appreciated. Neurologic:  Normal speech and language. No gross focal neurologic deficits are appreciated.  Skin:  Skin is warm, dry and intact. No rash noted.  Visualization of the right thigh reveals an erythematous area to the medial aspect of the thigh.  This measures approximately 8 cm in diameter.  No fluctuance or induration.  In the center of the lesion, there is a pustule-like lesion.  No drainage.  Area is marked with indelible marker.  No other appreciable skin findings. Psychiatric: Mood and affect are normal. Speech and behavior are normal. Patient exhibits appropriate insight and judgement.   ____________________________________________   LABS (all labs ordered are listed, but only abnormal results are displayed)  Labs Reviewed - No data to display ____________________________________________  EKG   ____________________________________________  RADIOLOGY   No results found.  ____________________________________________    PROCEDURES  Procedure(s) performed:    Procedures    Medications  clindamycin (CLEOCIN) capsule 300 mg (300 mg Oral Given 05/05/18 2145)     ____________________________________________   INITIAL IMPRESSION / ASSESSMENT AND PLAN / ED COURSE  Pertinent labs & imaging results that were available during my care of the patient were reviewed by me and considered in my medical decision making (see chart for details).  Review of the  [place] was performed in accordance of the [facility] prior to dispensing any controlled  drugs.      Patient's diagnosis is consistent with cellulitis to the right thigh.  Patient presents the emergency department with a complaint of skin changes to the medial thigh.  On exam, patient has area of cellulitis.  No underlying indication of abscess.  No indication for incision or drainage.  No indication for labs or imaging.  Patient is allergic to Bactrim so I will start the patient on clindamycin for MRSA coverage.  Return precautions discussed with patient.  Edges are marked with indelible marker.  Follow-up primary care as needed. Patient is given ED precautions to return to the ED for any worsening or new symptoms.     ____________________________________________  FINAL CLINICAL IMPRESSION(S) / ED DIAGNOSES  Final diagnoses:  Cellulitis of right lower extremity      NEW MEDICATIONS STARTED DURING THIS VISIT:  ED Discharge Orders         Ordered    clindamycin (CLEOCIN) 300 MG capsule  4 times daily     05/05/18 2145  This chart was dictated using voice recognition software/Dragon. Despite best efforts to proofread, errors can occur which can change the meaning. Any change was purely unintentional.    Lanette Hampshire 05/05/18 2149    Phineas Semen, MD 05/05/18 2223

## 2019-01-02 ENCOUNTER — Emergency Department
Admission: EM | Admit: 2019-01-02 | Discharge: 2019-01-02 | Disposition: A | Discharge status: Home / Self Care | Payer: Self-pay | Attending: Emergency Medicine | Admitting: Emergency Medicine

## 2019-01-02 ENCOUNTER — Emergency Department: Payer: Self-pay

## 2019-01-02 ENCOUNTER — Encounter: Payer: Self-pay | Admitting: Emergency Medicine

## 2019-01-02 ENCOUNTER — Other Ambulatory Visit: Payer: Self-pay

## 2019-01-02 DIAGNOSIS — R102 Pelvic and perineal pain: Secondary | ICD-10-CM | POA: Insufficient documentation

## 2019-01-02 DIAGNOSIS — Z79899 Other long term (current) drug therapy: Secondary | ICD-10-CM | POA: Insufficient documentation

## 2019-01-02 DIAGNOSIS — J45909 Unspecified asthma, uncomplicated: Secondary | ICD-10-CM | POA: Insufficient documentation

## 2019-01-02 LAB — URINALYSIS, COMPLETE (UACMP) WITH MICROSCOPIC
Bacteria, UA: NONE SEEN
Bilirubin Urine: NEGATIVE
Glucose, UA: NEGATIVE mg/dL
Hgb urine dipstick: NEGATIVE
Ketones, ur: NEGATIVE mg/dL
Leukocytes,Ua: NEGATIVE
Nitrite: NEGATIVE
Protein, ur: 30 mg/dL — AB
Specific Gravity, Urine: 1.033 — ABNORMAL HIGH (ref 1.005–1.030)
pH: 5 (ref 5.0–8.0)

## 2019-01-02 LAB — COMPREHENSIVE METABOLIC PANEL
ALT: 13 U/L (ref 0–44)
AST: 16 U/L (ref 15–41)
Albumin: 4.2 g/dL (ref 3.5–5.0)
Alkaline Phosphatase: 78 U/L (ref 38–126)
Anion gap: 10 (ref 5–15)
BUN: 13 mg/dL (ref 6–20)
CO2: 26 mmol/L (ref 22–32)
Calcium: 8.8 mg/dL — ABNORMAL LOW (ref 8.9–10.3)
Chloride: 105 mmol/L (ref 98–111)
Creatinine, Ser: 0.88 mg/dL (ref 0.44–1.00)
GFR calc Af Amer: >60 mL/min (ref >60)
GFR calc non Af Amer: >60 mL/min (ref >60)
Glucose, Bld: 103 mg/dL — ABNORMAL HIGH (ref 70–99)
Potassium: 3 mmol/L — ABNORMAL LOW (ref 3.5–5.1)
Sodium: 141 mmol/L (ref 135–145)
Total Bilirubin: 0.3 mg/dL (ref 0.3–1.2)
Total Protein: 7 g/dL (ref 6.5–8.1)

## 2019-01-02 LAB — CBC
HCT: 34.2 % — ABNORMAL LOW (ref 36.0–46.0)
Hemoglobin: 11.8 g/dL — ABNORMAL LOW (ref 12.0–15.0)
MCH: 30 pg (ref 26.0–34.0)
MCHC: 34.5 g/dL (ref 30.0–36.0)
MCV: 87 fL (ref 80.0–100.0)
Platelets: 368 10*3/uL (ref 150–400)
RBC: 3.93 MIL/uL (ref 3.87–5.11)
RDW: 12.6 % (ref 11.5–15.5)
WBC: 7.2 10*3/uL (ref 4.0–10.5)
nRBC: 0 % (ref 0.0–0.2)

## 2019-01-02 LAB — LIPASE, BLOOD: Lipase: 27 U/L (ref 11–51)

## 2019-01-02 LAB — POCT PREGNANCY, URINE: Preg Test, Ur: NEGATIVE

## 2019-01-02 MED ORDER — OXYCODONE-ACETAMINOPHEN 5-325 MG PO TABS
1.0000 | ORAL_TABLET | Freq: Once | ORAL | Status: AC
  Administered 2019-01-02: 1 via ORAL
  Filled 2019-01-02: qty 1

## 2019-01-02 MED ORDER — KETOROLAC TROMETHAMINE 10 MG PO TABS: 10.0000 mg | ORAL_TABLET | Freq: Four times a day (QID) | ORAL | 0 refills | Status: AC | PRN

## 2019-01-02 MED ORDER — KETOROLAC TROMETHAMINE 30 MG/ML IJ SOLN
30.0000 mg | Freq: Once | INTRAMUSCULAR | Status: AC
  Administered 2019-01-02: 30 mg via INTRAMUSCULAR
  Filled 2019-01-02: qty 1

## 2019-01-02 MED ORDER — ONDANSETRON 4 MG PO TBDP
4.0000 mg | ORAL_TABLET | Freq: Once | ORAL | Status: AC
  Administered 2019-01-02: 4 mg via ORAL
  Filled 2019-01-02: qty 1

## 2019-01-02 MED ORDER — SODIUM CHLORIDE 0.9% FLUSH: 3.0000 mL | Freq: Once | INTRAVENOUS | Status: DC

## 2019-01-02 NOTE — ED Notes (Signed)
First Nurse Note: Pt to ED via POV c/o lower abdominal pain. Pt is in NAD at this time. Pt has a bottle of water with her, advised not to drink anything until she sees the doctor.

## 2019-01-02 NOTE — ED Triage Notes (Signed)
Lower L abdominal pain x 3 days. Doesn't keep track of periods to know if this period was late but states that it is heavier than usual.

## 2019-01-02 NOTE — ED Provider Notes (Signed)
[facility] Emergency Department Provider Note  ____________________________________________  Time seen: Approximately 9:15 PM  I have reviewed the triage vital signs and the nursing notes.   HISTORY  Chief Complaint Abdominal Pain    HPI Grant FontanaJuanita D Wong is a 33 y.o. female presents to the emergency department with 10 out of 10 left-sided pelvic pain.  Patient reports that 3 days ago, she started having heavy vaginal bleeding and left-sided pelvic discomfort.  Patient reports that her menstrual cycle usually consists of 2 to 3 days of light vaginal bleeding.  Patient states that she used almost an entire box of tampons the first day she experienced vaginal bleeding.  She states that her pelvic pain is made her feel lightheaded and nauseated at times.  She denies a history of ovarian cysts or ovarian torsion in the past.  She reports that her vaginal bleeding has improved significantly and has almost resolved.  No other alleviating measures have been attempted.        Past Medical History:  Diagnosis Date  . Asthma   . Thyroid disease     Patient Active Problem List   Diagnosis Date Noted  . Labor and delivery indication for care or intervention 09/13/2016    Past Surgical History:  Procedure Laterality Date  . APPENDECTOMY    . CHOLECYSTECTOMY    . DILATION AND CURETTAGE OF UTERUS    . KNEE SURGERY Right     Prior to Admission medications   Medication Sig Start Date End Date Taking? Authorizing Provider  acetaminophen (TYLENOL) 500 MG tablet Take 1,000 mg by mouth every 6 (six) hours as needed.    [provider]  albuterol (PROVENTIL) (2.5 MG/3ML) 0.083% nebulizer solution Take 3 mLs (2.5 mg total) by nebulization every 6 (six) hours as needed for wheezing or shortness of breath. 03/21/18   Merrily Brittleifenbark, Neil, MD  clindamycin (CLEOCIN) 300 MG capsule Take 1 capsule (300 mg total) by mouth 4 (four) times daily. 05/05/18   Cuthriell, Delorise RoyalsJonathan D,  PA-C  cyclobenzaprine (FLEXERIL) 10 MG tablet Take 1 tablet (10 mg total) by mouth 3 (three) times daily as needed for muscle spasms. 04/05/18   Triplett, Rulon Eisenmengerari B, FNP  HYDROcodone-acetaminophen (NORCO/VICODIN) 5-325 MG tablet Take 1 tablet by mouth every 4 (four) hours as needed. 08/28/17   Minna AntisPaduchowski, Kevin, MD  ketorolac (TORADOL) 10 MG tablet Take 1 tablet (10 mg total) by mouth every 6 (six) hours as needed for up to 5 days. 01/02/19 01/07/19  Orvil FeilWoods, Delcenia Inman M, PA-C  Prenatal Vit-Fe Fumarate-FA (MULTIVITAMIN-PRENATAL) 27-0.8 MG TABS tablet Take 1 tablet by mouth daily at 12 noon.    [provider]    Allergies Flagyl [metronidazole], Bactrim [sulfamethoxazole-trimethoprim], Tape, and Tramadol  No family history on file.  Social History Social History   Tobacco Use  . Smoking status: Never Smoker  . Smokeless tobacco: Never Used  Substance Use Topics  . Alcohol use: No  . Drug use: No     Review of Systems  Constitutional: No fever/chills Eyes: No visual changes. No discharge ENT: No upper respiratory complaints. Cardiovascular: no chest pain. Respiratory: no cough. No SOB. Gastrointestinal: No abdominal pain.  No nausea, no vomiting.  No diarrhea.  No constipation. Genitourinary: Patient has vaginal bleeding.  Musculoskeletal: Negative for musculoskeletal pain. Skin: Negative for rash, abrasions, lacerations, ecchymosis. Neurological: Negative for headaches, focal weakness or numbness.   ____________________________________________   PHYSICAL EXAM:  VITAL SIGNS: ED Triage Vitals  Enc Vitals Group  BP 01/02/19 1830 (!) 153/91     Pulse Rate 01/02/19 1830 60     Resp 01/02/19 1830 18     Temp 01/02/19 1830 98.3 F (36.8 C)     Temp Source 01/02/19 1830 Oral     SpO2 01/02/19 1830 99 %     Weight 01/02/19 1831 207 lb (93.9 kg)     Height 01/02/19 1831 5\' 4"  (1.626 m)     Head Circumference --      Peak Flow --      Pain Score 01/02/19 1831 7      Pain Loc --      Pain Edu? --      Excl. in [place]? --      Constitutional: Alert and oriented. Well appearing and in no acute distress. Eyes: Conjunctivae are normal. PERRL. EOMI. Head: Atraumatic. ENT: Cardiovascular: Normal rate, regular rhythm. Normal S1 and S2.  Good peripheral circulation. Respiratory: Normal respiratory effort without tachypnea or retractions. Lungs CTAB. Good air entry to the bases with no decreased or absent breath sounds. Gastrointestinal: Bowel sounds 4 quadrants.  Patient has tenderness to palpation in left lower quadrant of abdomen without guarding or rigidity. No palpable masses. No distention. No CVA tenderness. Musculoskeletal: Full range of motion to all extremities. No gross deformities appreciated. Neurologic:  Normal speech and language. No gross focal neurologic deficits are appreciated.  Skin:  Skin is warm, dry and intact. No rash noted. Psychiatric: Mood and affect are normal. Speech and behavior are normal. Patient exhibits appropriate insight and judgement.   ____________________________________________   LABS (all labs ordered are listed, but only abnormal results are displayed)  Labs Reviewed  COMPREHENSIVE METABOLIC PANEL - Abnormal; Notable for the following components:      Result Value   Potassium 3.0 (*)    Glucose, Bld 103 (*)    Calcium 8.8 (*)    All other components within normal limits  CBC - Abnormal; Notable for the following components:   Hemoglobin 11.8 (*)    HCT 34.2 (*)    All other components within normal limits  URINALYSIS, COMPLETE (UACMP) WITH MICROSCOPIC - Abnormal; Notable for the following components:   Color, Urine YELLOW (*)    APPearance HAZY (*)    Specific Gravity, Urine 1.033 (*)    Protein, ur 30 (*)    All other components within normal limits  LIPASE, BLOOD  POC URINE PREG, ED  POCT PREGNANCY, URINE    ____________________________________________  EKG   ____________________________________________  RADIOLOGY I personally viewed and evaluated these images as part of my medical decision making, as well as reviewing the written report by the radiologist.    US Pelvic Complete W Transvaginal And Torsion R/o  Result Date: 01/02/2019 CLINICAL DATA:  Initial evaluation for acute left-sided pelvic pain for 3 days. EXAM: TRANSABDOMINAL AND TRANSVAGINAL ULTRASOUND OF PELVIS DOPPLER ULTRASOUND OF OVARIES TECHNIQUE: Both transabdominal and transvaginal ultrasound examinations of the pelvis were performed. Transabdominal technique was performed for global imaging of the pelvis including uterus, ovaries, adnexal regions, and pelvic cul-de-sac. It was necessary to proceed with endovaginal exam following the transabdominal exam to visualize the uterus, endometrium, and ovaries. Color and duplex Doppler ultrasound was utilized to evaluate blood flow to the ovaries. COMPARISON:  Prior ultrasound from 12/07/2016. FINDINGS: Uterus Measurements: 7.3 x 4.2 x 5.7 cm = volume: 91.2 mL. Uterus is anteverted, retroflexed, and septated. No discrete fibroid or other mass. Endometrium Thickness: 3.9 mm.  No focal abnormality visualized. Right  ovary Measurements: 2.5 x 1.0 x 1.2 cm = volume: 1.5 mL. Normal appearance/no adnexal mass. Small dominant follicle noted. Left ovary Measurements: 2.7 x 2.0 x 1.4 cm = volume: 3.8 mL. Normal appearance/no adnexal mass. Pulsed Doppler evaluation of both ovaries demonstrates normal low-resistance arterial and venous waveforms. Other findings No abnormal free fluid. IMPRESSION: Normal pelvic ultrasound. No evidence for ovarian torsion or other acute abnormality. Electronically Signed   By: Rise Mu M.D.   On: 01/02/2019 22:46    ____________________________________________    PROCEDURES  Procedure(s) performed:    Procedures    Medications  sodium chloride  flush (NS) 0.9 % injection 3 mL (has no administration in time range)  oxyCODONE-acetaminophen (PERCOCET/ROXICET) 5-325 MG per tablet 1 tablet (1 tablet Oral Given 01/02/19 2130)  ondansetron (ZOFRAN-ODT) disintegrating tablet 4 mg (4 mg Oral Given 01/02/19 2131)  ketorolac (TORADOL) 30 MG/ML injection 30 mg (30 mg Intramuscular Given 01/02/19 2312)     ____________________________________________   INITIAL IMPRESSION / ASSESSMENT AND PLAN / ED COURSE  Pertinent labs & imaging results that were available during my care of the patient were reviewed by me and considered in my medical decision making (see chart for details).  Review of the [place] CSRS was performed in accordance of the [facility] prior to dispensing any controlled drugs.           Assessment and Plan:  Vaginal Bleeding:  33 year old female presents to the emergency department with vaginal bleeding that has occurred for the past 3 days and left-sided pelvic pain.  Patient was hypertensive at triage but vital signs were otherwise reassuring.  She had left lower quadrant abdominal tenderness to palpation without guarding.  Differential diagnosis includes ovarian cyst, ovarian torsion, menses, pelvic abscess...  There is no evidence of ovarian torsion or ovarian cyst on pelvic ultrasound.  No evidence of pelvic abscess.  Patient reports that her pain improved significantly with Toradol and Percocet administered in the emergency department.  Patient was discharged with oral Toradol.  She was advised to follow-up with gynecology if symptoms persist.  All patient questions were answered. ____________________________________________  FINAL CLINICAL IMPRESSION(S) / ED DIAGNOSES  Final diagnoses:  Pelvic pain      NEW MEDICATIONS STARTED DURING THIS VISIT:  ED Discharge Orders         Ordered    ketorolac (TORADOL) 10 MG tablet  Every 6 hours PRN     01/02/19 2318              This chart was dictated using voice  recognition software/Dragon. Despite best efforts to proofread, errors can occur which can change the meaning. Any change was purely unintentional.    Orvil Feil, PA-C 01/02/19 2324    Emily Filbert, MD 01/03/19 1455

## 2019-03-26 ENCOUNTER — Ambulatory Visit: Payer: Self-pay | Attending: Internal Medicine

## 2019-03-26 DIAGNOSIS — Z20822 Contact with and (suspected) exposure to covid-19: Secondary | ICD-10-CM | POA: Insufficient documentation

## 2019-03-27 LAB — NOVEL CORONAVIRUS, NAA: SARS-CoV-2, NAA: NOT DETECTED

## 2019-05-11 ENCOUNTER — Emergency Department
Admission: EM | Admit: 2019-05-11 | Discharge: 2019-05-11 | Disposition: A | Discharge status: Home / Self Care | Payer: Self-pay | Attending: Emergency Medicine | Admitting: Emergency Medicine

## 2019-05-11 ENCOUNTER — Other Ambulatory Visit: Payer: Self-pay

## 2019-05-11 DIAGNOSIS — N390 Urinary tract infection, site not specified: Secondary | ICD-10-CM | POA: Insufficient documentation

## 2019-05-11 DIAGNOSIS — R319 Hematuria, unspecified: Secondary | ICD-10-CM | POA: Insufficient documentation

## 2019-05-11 DIAGNOSIS — J45909 Unspecified asthma, uncomplicated: Secondary | ICD-10-CM | POA: Insufficient documentation

## 2019-05-11 DIAGNOSIS — R350 Frequency of micturition: Secondary | ICD-10-CM | POA: Insufficient documentation

## 2019-05-11 LAB — URINALYSIS, COMPLETE (UACMP) WITH MICROSCOPIC
Bilirubin Urine: NEGATIVE
Glucose, UA: NEGATIVE mg/dL
Ketones, ur: NEGATIVE mg/dL
Nitrite: NEGATIVE
Protein, ur: NEGATIVE mg/dL
Specific Gravity, Urine: 1.006 (ref 1.005–1.030)
WBC, UA: >50 WBC/hpf — ABNORMAL HIGH (ref 0–5)
pH: 6 (ref 5.0–8.0)

## 2019-05-11 LAB — COMPREHENSIVE METABOLIC PANEL
ALT: 17 U/L (ref 0–44)
AST: 22 U/L (ref 15–41)
Albumin: 4.9 g/dL (ref 3.5–5.0)
Alkaline Phosphatase: 89 U/L (ref 38–126)
Anion gap: 10 (ref 5–15)
BUN: 9 mg/dL (ref 6–20)
CO2: 27 mmol/L (ref 22–32)
Calcium: 9.3 mg/dL (ref 8.9–10.3)
Chloride: 100 mmol/L (ref 98–111)
Creatinine, Ser: 0.77 mg/dL (ref 0.44–1.00)
GFR calc Af Amer: >60 mL/min (ref >60)
GFR calc non Af Amer: >60 mL/min (ref >60)
Glucose, Bld: 76 mg/dL (ref 70–99)
Potassium: 3.6 mmol/L (ref 3.5–5.1)
Sodium: 137 mmol/L (ref 135–145)
Total Bilirubin: 0.5 mg/dL (ref 0.3–1.2)
Total Protein: 8.1 g/dL (ref 6.5–8.1)

## 2019-05-11 LAB — CBC
HCT: 40.5 % (ref 36.0–46.0)
Hemoglobin: 13.4 g/dL (ref 12.0–15.0)
MCH: 30.2 pg (ref 26.0–34.0)
MCHC: 33.1 g/dL (ref 30.0–36.0)
MCV: 91.2 fL (ref 80.0–100.0)
Platelets: 415 10*3/uL — ABNORMAL HIGH (ref 150–400)
RBC: 4.44 MIL/uL (ref 3.87–5.11)
RDW: 12.5 % (ref 11.5–15.5)
WBC: 13 10*3/uL — ABNORMAL HIGH (ref 4.0–10.5)
nRBC: 0 % (ref 0.0–0.2)

## 2019-05-11 LAB — POCT PREGNANCY, URINE: Preg Test, Ur: NEGATIVE

## 2019-05-11 LAB — LIPASE, BLOOD: Lipase: 23 U/L (ref 11–51)

## 2019-05-11 MED ORDER — SODIUM CHLORIDE 0.9 % IV SOLN
1.0000 g | Freq: Once | INTRAVENOUS | Status: AC
  Administered 2019-05-11: 1 g via INTRAVENOUS
  Filled 2019-05-11: qty 10

## 2019-05-11 MED ORDER — SODIUM CHLORIDE 0.9% FLUSH: 3.0000 mL | Freq: Once | INTRAVENOUS | Status: DC

## 2019-05-11 MED ORDER — CEPHALEXIN 500 MG PO CAPS: 500.0000 mg | ORAL_CAPSULE | Freq: Four times a day (QID) | ORAL | 0 refills | Status: AC

## 2019-05-11 NOTE — Discharge Instructions (Addendum)
Please seek medical attention for any high fevers, chest pain, shortness of breath, change in behavior, persistent vomiting, bloody stool or any other new or concerning symptoms.  

## 2019-05-11 NOTE — ED Provider Notes (Signed)
[facility] Emergency Department Provider Note  ____________________________________________   I have reviewed the triage vital signs and the nursing notes.   HISTORY  Chief Complaint Abdominal Pain and Hematuria   History limited by: Not Limited   HPI Jessica Wong is a 34 y.o. female who presents to the emergency department today because of concern for abdominal pain and hematuria. The patient states that for the past 5 days she has noticed lower abdominal pain. This has been accompanied by burning with urination. She has had increased urination and bad odor as well. Today she noticed some blood in her urine. Has history of UTIs although has never had any bleeding before. She denies any fevers, nausea or vomiting.    Records reviewed. Per medical record review patient has a history of appendectomy and cholecystectomy.   Past Medical History:  Diagnosis Date  . Asthma   . Thyroid disease     Patient Active Problem List   Diagnosis Date Noted  . Labor and delivery indication for care or intervention 09/13/2016    Past Surgical History:  Procedure Laterality Date  . APPENDECTOMY    . CHOLECYSTECTOMY    . DILATION AND CURETTAGE OF UTERUS    . KNEE SURGERY Right     Prior to Admission medications   Medication Sig Start Date End Date Taking? Authorizing Provider  acetaminophen (TYLENOL) 500 MG tablet Take 1,000 mg by mouth every 6 (six) hours as needed.    [provider]  albuterol (PROVENTIL) (2.5 MG/3ML) 0.083% nebulizer solution Take 3 mLs (2.5 mg total) by nebulization every 6 (six) hours as needed for wheezing or shortness of breath. 03/21/18   Darel Hong, MD  clindamycin (CLEOCIN) 300 MG capsule Take 1 capsule (300 mg total) by mouth 4 (four) times daily. 05/05/18   Cuthriell, Charline Bills, PA-C  cyclobenzaprine (FLEXERIL) 10 MG tablet Take 1 tablet (10 mg total) by mouth 3 (three) times daily as needed for muscle spasms. 04/05/18    Triplett, Johnette Abraham B, FNP  HYDROcodone-acetaminophen (NORCO/VICODIN) 5-325 MG tablet Take 1 tablet by mouth every 4 (four) hours as needed. 08/28/17   Harvest Dark, MD  Prenatal Vit-Fe Fumarate-FA (MULTIVITAMIN-PRENATAL) 27-0.8 MG TABS tablet Take 1 tablet by mouth daily at 12 noon.    [provider]    Allergies Flagyl [metronidazole], Bactrim [sulfamethoxazole-trimethoprim], Tape, and Tramadol  No family history on file.  Social History Social History   Tobacco Use  . Smoking status: Never Smoker  . Smokeless tobacco: Never Used  Substance Use Topics  . Alcohol use: No  . Drug use: No    Review of Systems Constitutional: No fever/chills Eyes: No visual changes. ENT: No sore throat. Cardiovascular: Denies chest pain. Respiratory: Denies shortness of breath. Gastrointestinal: Positive for lower abdominal pain. Genitourinary: Positive for dysuria. Musculoskeletal: Positive for low back pain. Skin: Negative for rash. Neurological: Negative for headaches, focal weakness or numbness.  ____________________________________________   PHYSICAL EXAM:  VITAL SIGNS: ED Triage Vitals  Enc Vitals Group     BP 05/11/19 1840 (!) 157/83     Pulse Rate 05/11/19 1840 72     Resp 05/11/19 1840 18     Temp 05/11/19 1840 98.4 F (36.9 C)     Temp src --      SpO2 05/11/19 1840 100 %     Weight --      Height --      Head Circumference --      Peak Flow --  Pain Score 05/11/19 1838 6   Constitutional: Alert and oriented.  Eyes: Conjunctivae are normal.  ENT      Head: Normocephalic and atraumatic.      Nose: No congestion/rhinnorhea.      Mouth/Throat: Mucous membranes are moist.      Neck: No stridor. Hematological/Lymphatic/Immunilogical: No cervical lymphadenopathy. Cardiovascular: Normal rate, regular rhythm.  No murmurs, rubs, or gallops.  Respiratory: Normal respiratory effort without tachypnea nor retractions. Breath sounds are clear and equal  bilaterally. No wheezes/rales/rhonchi. Gastrointestinal: Soft and non tender. No rebound. No guarding.  Genitourinary: Deferred Musculoskeletal: Normal range of motion in all extremities. No lower extremity edema. Neurologic:  Normal speech and language. No gross focal neurologic deficits are appreciated.  Skin:  Skin is warm, dry and intact. No rash noted. Psychiatric: Mood and affect are normal. Speech and behavior are normal. Patient exhibits appropriate insight and judgment.  ____________________________________________    LABS (pertinent positives/negatives)  Upreg negative Lipase 23 CBC wbc 13.0, hgb 13.4, plt 415 CMP wnl UA cloudy, moderate hgb dipstick, large leukocytes, >50 wbc, wbc clumps presents.  ____________________________________________   EKG  None  ____________________________________________    RADIOLOGY  None  ____________________________________________   PROCEDURES  Procedures  ____________________________________________   INITIAL IMPRESSION / ASSESSMENT AND PLAN / ED COURSE  Pertinent labs & imaging results that were available during my care of the patient were reviewed by me and considered in my medical decision making (see chart for details).   Patient presents to the emergency department because of concern for abdominal pain and hematuria. Symptoms and work up consistent with UTI. At this time I doubt kidney stone given clinical history and exam. Will give patient dose of antibiotics here in the emergency department and plan on discharging home with further antibiotics. Discussed findings and plan with patient.   ___________________________________________   FINAL CLINICAL IMPRESSION(S) / ED DIAGNOSES  Final diagnoses:  Lower urinary tract infectious disease     Note: This dictation was prepared with Dragon dictation. Any transcriptional errors that result from this process are unintentional     Phineas Semen, MD 05/11/19  1954

## 2019-05-11 NOTE — ED Triage Notes (Signed)
Pt presents to ED via POV with c/o lower abdominal pain x 5 days, pt states noted hematuria earlier today, c/o nausea, denies V/D at this time.

## 2019-05-13 LAB — URINE CULTURE: Culture: >=100000 — AB

## 2019-09-05 ENCOUNTER — Other Ambulatory Visit: Payer: Self-pay

## 2019-09-05 ENCOUNTER — Emergency Department
Admission: EM | Admit: 2019-09-05 | Discharge: 2019-09-06 | Disposition: A | Discharge status: Home / Self Care | Payer: Self-pay | Attending: Emergency Medicine | Admitting: Emergency Medicine

## 2019-09-05 ENCOUNTER — Encounter: Payer: Self-pay | Admitting: Emergency Medicine

## 2019-09-05 DIAGNOSIS — K529 Noninfective gastroenteritis and colitis, unspecified: Secondary | ICD-10-CM | POA: Insufficient documentation

## 2019-09-05 DIAGNOSIS — Z20822 Contact with and (suspected) exposure to covid-19: Secondary | ICD-10-CM | POA: Insufficient documentation

## 2019-09-05 DIAGNOSIS — J45909 Unspecified asthma, uncomplicated: Secondary | ICD-10-CM | POA: Insufficient documentation

## 2019-09-05 DIAGNOSIS — E860 Dehydration: Secondary | ICD-10-CM | POA: Insufficient documentation

## 2019-09-05 LAB — URINALYSIS, COMPLETE (UACMP) WITH MICROSCOPIC
Bacteria, UA: NONE SEEN
Bilirubin Urine: NEGATIVE
Glucose, UA: NEGATIVE mg/dL
Ketones, ur: NEGATIVE mg/dL
Leukocytes,Ua: NEGATIVE
Nitrite: NEGATIVE
Protein, ur: NEGATIVE mg/dL
Specific Gravity, Urine: 1.018 (ref 1.005–1.030)
pH: 5 (ref 5.0–8.0)

## 2019-09-05 LAB — COMPREHENSIVE METABOLIC PANEL
ALT: 14 U/L (ref 0–44)
AST: 16 U/L (ref 15–41)
Albumin: 4.3 g/dL (ref 3.5–5.0)
Alkaline Phosphatase: 70 U/L (ref 38–126)
Anion gap: 9 (ref 5–15)
BUN: 13 mg/dL (ref 6–20)
CO2: 26 mmol/L (ref 22–32)
Calcium: 8.9 mg/dL (ref 8.9–10.3)
Chloride: 104 mmol/L (ref 98–111)
Creatinine, Ser: 0.74 mg/dL (ref 0.44–1.00)
GFR calc Af Amer: >60 mL/min (ref >60)
GFR calc non Af Amer: >60 mL/min (ref >60)
Glucose, Bld: 100 mg/dL — ABNORMAL HIGH (ref 70–99)
Potassium: 3.4 mmol/L — ABNORMAL LOW (ref 3.5–5.1)
Sodium: 139 mmol/L (ref 135–145)
Total Bilirubin: 0.6 mg/dL (ref 0.3–1.2)
Total Protein: 7.3 g/dL (ref 6.5–8.1)

## 2019-09-05 LAB — SARS CORONAVIRUS 2 BY RT PCR (HOSPITAL ORDER, PERFORMED IN ~~LOC~~ HOSPITAL LAB): SARS Coronavirus 2: NEGATIVE

## 2019-09-05 LAB — CBC
HCT: 39.2 % (ref 36.0–46.0)
Hemoglobin: 13.5 g/dL (ref 12.0–15.0)
MCH: 30.4 pg (ref 26.0–34.0)
MCHC: 34.4 g/dL (ref 30.0–36.0)
MCV: 88.3 fL (ref 80.0–100.0)
Platelets: 342 10*3/uL (ref 150–400)
RBC: 4.44 MIL/uL (ref 3.87–5.11)
RDW: 12.3 % (ref 11.5–15.5)
WBC: 9 10*3/uL (ref 4.0–10.5)
nRBC: 0 % (ref 0.0–0.2)

## 2019-09-05 LAB — POCT PREGNANCY, URINE: Preg Test, Ur: NEGATIVE

## 2019-09-05 LAB — LIPASE, BLOOD: Lipase: 33 U/L (ref 11–51)

## 2019-09-05 MED ORDER — SODIUM CHLORIDE 0.9 % IV BOLUS
1000.0000 mL | Freq: Once | INTRAVENOUS | Status: AC
  Administered 2019-09-05: 1000 mL via INTRAVENOUS

## 2019-09-05 MED ORDER — PROMETHAZINE HCL 25 MG PO TABS
25.0000 mg | ORAL_TABLET | Freq: Once | ORAL | Status: AC
  Administered 2019-09-05: 25 mg via ORAL
  Filled 2019-09-05: qty 1

## 2019-09-05 MED ORDER — ONDANSETRON 4 MG PO TBDP: 4.0000 mg | ORAL_TABLET | Freq: Three times a day (TID) | ORAL | 0 refills | Status: DC | PRN

## 2019-09-05 MED ORDER — SODIUM CHLORIDE 0.9 % IV BOLUS: 500.0000 mL | Freq: Once | INTRAVENOUS | Status: DC

## 2019-09-05 MED ORDER — ONDANSETRON 4 MG PO TBDP
4.0000 mg | ORAL_TABLET | Freq: Once | ORAL | Status: AC | PRN
  Administered 2019-09-05: 4 mg via ORAL
  Filled 2019-09-05: qty 1

## 2019-09-05 MED ORDER — ONDANSETRON 8 MG PO TBDP
8.0000 mg | ORAL_TABLET | Freq: Once | ORAL | Status: AC
  Administered 2019-09-05: 8 mg via ORAL
  Filled 2019-09-05: qty 1

## 2019-09-05 NOTE — ED Provider Notes (Signed)
[facility] Emergency Department Provider Note  ____________________________________________   First MD Initiated Contact with Patient 09/05/19 2110     (approximate)  I have reviewed the triage vital signs and the nursing notes.   HISTORY  Chief Complaint Emesis and Diarrhea    HPI Jessica Wong is a 34 y.o. female presents emergency department complaining of vomiting with diarrhea for 3 to 4 days.  Patient states it is watery.  Cramping in the abdomen but no localized pain.  Took over-the-counter Pepto without any relief.  Some chills but no known fever.  Questionable Covid exposure as her husband was exposed at work.  She denies chest pain or shortness of breath.  Pain scale rated at 5/10   Past Medical History:  Diagnosis Date  . Asthma   . Thyroid disease     Patient Active Problem List   Diagnosis Date Noted  . Labor and delivery indication for care or intervention 09/13/2016    Past Surgical History:  Procedure Laterality Date  . APPENDECTOMY    . CHOLECYSTECTOMY    . DILATION AND CURETTAGE OF UTERUS    . KNEE SURGERY Right     Prior to Admission medications   Medication Sig Start Date End Date Taking? Authorizing Provider  acetaminophen (TYLENOL) 500 MG tablet Take 1,000 mg by mouth every 6 (six) hours as needed.    [provider]  albuterol (PROVENTIL) (2.5 MG/3ML) 0.083% nebulizer solution Take 3 mLs (2.5 mg total) by nebulization every 6 (six) hours as needed for wheezing or shortness of breath. 03/21/18   Merrily Brittle, MD  clindamycin (CLEOCIN) 300 MG capsule Take 1 capsule (300 mg total) by mouth 4 (four) times daily. 05/05/18   Cuthriell, Delorise Royals, PA-C  cyclobenzaprine (FLEXERIL) 10 MG tablet Take 1 tablet (10 mg total) by mouth 3 (three) times daily as needed for muscle spasms. 04/05/18   Triplett, Rulon Eisenmenger B, FNP  HYDROcodone-acetaminophen (NORCO/VICODIN) 5-325 MG tablet Take 1 tablet by mouth every 4 (four) hours as  needed. 08/28/17   Minna Antis, MD  ondansetron (ZOFRAN-ODT) 4 MG disintegrating tablet Take 1 tablet (4 mg total) by mouth every 8 (eight) hours as needed. 09/05/19   Marshell Dilauro, Roselyn Bering, PA-C  Prenatal Vit-Fe Fumarate-FA (MULTIVITAMIN-PRENATAL) 27-0.8 MG TABS tablet Take 1 tablet by mouth daily at 12 noon.    [provider]    Allergies Metronidazole, Bactrim [sulfamethoxazole-trimethoprim], Tape, and Tramadol  History reviewed. No pertinent family history.  Social History Social History   Tobacco Use  . Smoking status: Never Smoker  . Smokeless tobacco: Never Used  Substance Use Topics  . Alcohol use: No  . Drug use: No    Review of Systems  Constitutional: No fever/ positivechills Eyes: No visual changes. ENT: No sore throat. Respiratory: Denies cough Cardiovascular: Denies chest pain Gastrointestinal: Positive N/V/D with abdominal cramping Genitourinary: Negative for dysuria. Musculoskeletal: Negative for back pain. Skin: Negative for rash. Psychiatric: no mood changes,     ____________________________________________   PHYSICAL EXAM:  VITAL SIGNS: ED Triage Vitals  Enc Vitals Group     BP 09/05/19 1915 (!) 137/92     Pulse Rate 09/05/19 1915 72     Resp 09/05/19 1915 18     Temp 09/05/19 1915 98.8 F (37.1 C)     Temp Source 09/05/19 1915 Oral     SpO2 09/05/19 1915 97 %     Weight 09/05/19 1912 190 lb (86.2 kg)     Height 09/05/19  1912 5\' 4"  (1.626 m)     Head Circumference --      Peak Flow --      Pain Score 09/05/19 1911 5     Pain Loc --      Pain Edu? --      Excl. in [place]? --     Constitutional: Alert and oriented. Well appearing and in no acute distress. Eyes: Conjunctivae are normal.  Head: Atraumatic. Nose: No congestion/rhinnorhea. Mouth/Throat: Mucous membranes are moist.   Neck:  supple no lymphadenopathy noted Cardiovascular: Normal rate, regular rhythm. Heart sounds are normal Respiratory: Normal respiratory effort.  No  retractions, lungs c t a  Abd: soft nontender bs normal all 4 quad GU: deferred Musculoskeletal: FROM all extremities, warm and well perfused Neurologic:  Normal speech and language.  Skin:  Skin is warm, dry and intact. No rash noted. Psychiatric: Mood and affect are normal. Speech and behavior are normal.  ____________________________________________   LABS (all labs ordered are listed, but only abnormal results are displayed)  Labs Reviewed  COMPREHENSIVE METABOLIC PANEL - Abnormal; Notable for the following components:      Result Value   Potassium 3.4 (*)    Glucose, Bld 100 (*)    All other components within normal limits  URINALYSIS, COMPLETE (UACMP) WITH MICROSCOPIC - Abnormal; Notable for the following components:   Color, Urine YELLOW (*)    APPearance HAZY (*)    Hgb urine dipstick SMALL (*)    All other components within normal limits  SARS CORONAVIRUS 2 BY RT PCR (HOSPITAL ORDER, PERFORMED IN [place] [facility])  LIPASE, BLOOD  CBC  POC URINE PREG, ED  POCT PREGNANCY, URINE   ____________________________________________   ____________________________________________  RADIOLOGY    ____________________________________________   PROCEDURES  Procedure(s) performed: No  Procedures    ____________________________________________   INITIAL IMPRESSION / ASSESSMENT AND PLAN / ED COURSE  Pertinent labs & imaging results that were available during my care of the patient were reviewed by me and considered in my medical decision making (see chart for details).   Patient is 34 year old female presents emergency department with a positive Covid exposure along with nausea/vomiting/diarrhea for 3 days.  See HPI  Physical exam shows patient to appear well.  Vitals are normal.  Abdomen is minimally tender  DDx: Covid, gastroenteritis, food poisoning  Labs are reassuring that this is just a viral illness.  CBC is normal, lipase normal, comprehensive  metabolic panel is basically normal, urinalysis is normal.  Patient was given 1 L normal saline IV, Zofran 4 mg IV.  Patient did feel some relief after this medication.  Another normal saline  Discharge.  Prescription for Zofran.  Phenergan p.o. at discharge.  She was discharged stable condition.    TALER KUSHNER was evaluated in Emergency Department on 09/05/2019 for the symptoms described in the history of present illness. She was evaluated in the context of the global COVID-19 pandemic, which necessitated consideration that the patient might be at risk for infection with the SARS-CoV-2 virus that causes COVID-19. Institutional protocols and algorithms that pertain to the evaluation of patients at risk for COVID-19 are in a state of rapid change based on information released by regulatory bodies including the CDC and federal and state organizations. These policies and algorithms were followed during the patient's care in the ED.   As part of my medical decision making, I reviewed the following data within the electronic MEDICAL RECORD NUMBER Nursing notes reviewed and incorporated,  Labs reviewed , Old chart reviewed, Notes from prior ED visits and [place] Controlled Substance Database  ____________________________________________   FINAL CLINICAL IMPRESSION(S) / ED DIAGNOSES  Final diagnoses:  Dehydration  Gastroenteritis      NEW MEDICATIONS STARTED DURING THIS VISIT:  New Prescriptions   ONDANSETRON (ZOFRAN-ODT) 4 MG DISINTEGRATING TABLET    Take 1 tablet (4 mg total) by mouth every 8 (eight) hours as needed.     Note:  This document was prepared using Dragon voice recognition software and may include unintentional dictation errors.    Versie Starks, PA-C 09/05/19 2339    Earleen Newport, MD 09/05/19 [phone]

## 2019-09-05 NOTE — Discharge Instructions (Signed)
Follow-up with your regular doctor if not improving in 2 to 3 days.  Return emergency department worsening.  Take the Zofran for nausea/vomiting as needed.  Covid test was negative today.

## 2019-09-05 NOTE — ED Triage Notes (Signed)
Pt arrived via POV with c/o vomiting x 3 days along with diarrhea.  Vomiting x 4 today and multiple episodes of diarrhea.  Denies any abdominal pain, only c/o cramping. Unable to keep liquids down.  Pt states she has been taking Pepto without relief.

## 2019-09-09 ENCOUNTER — Other Ambulatory Visit: Payer: Self-pay

## 2019-09-10 ENCOUNTER — Other Ambulatory Visit: Payer: Self-pay

## 2019-10-21 ENCOUNTER — Encounter: Payer: Self-pay | Admitting: Emergency Medicine

## 2019-10-21 ENCOUNTER — Other Ambulatory Visit: Payer: Self-pay

## 2019-10-21 ENCOUNTER — Emergency Department
Admission: EM | Admit: 2019-10-21 | Discharge: 2019-10-21 | Disposition: A | Discharge status: Home / Self Care | Payer: HRSA Program | Attending: Student in an Organized Health Care Education/Training Program | Admitting: Student in an Organized Health Care Education/Training Program

## 2019-10-21 ENCOUNTER — Emergency Department: Payer: HRSA Program

## 2019-10-21 DIAGNOSIS — J069 Acute upper respiratory infection, unspecified: Secondary | ICD-10-CM | POA: Diagnosis not present

## 2019-10-21 DIAGNOSIS — Z79899 Other long term (current) drug therapy: Secondary | ICD-10-CM | POA: Diagnosis not present

## 2019-10-21 DIAGNOSIS — J45909 Unspecified asthma, uncomplicated: Secondary | ICD-10-CM | POA: Insufficient documentation

## 2019-10-21 DIAGNOSIS — U071 COVID-19: Secondary | ICD-10-CM | POA: Diagnosis not present

## 2019-10-21 DIAGNOSIS — R0981 Nasal congestion: Secondary | ICD-10-CM | POA: Diagnosis present

## 2019-10-21 LAB — SARS CORONAVIRUS 2 (TAT 6-24 HRS): SARS Coronavirus 2: POSITIVE — AB

## 2019-10-21 MED ORDER — BENZONATATE 100 MG PO CAPS: 100.0000 mg | ORAL_CAPSULE | Freq: Three times a day (TID) | ORAL | 0 refills | Status: AC | PRN

## 2019-10-21 MED ORDER — IPRATROPIUM-ALBUTEROL 0.5-2.5 (3) MG/3ML IN SOLN
3.0000 mL | Freq: Once | RESPIRATORY_TRACT | Status: AC
  Administered 2019-10-21: 3 mL via RESPIRATORY_TRACT
  Filled 2019-10-21: qty 3

## 2019-10-21 MED ORDER — ALBUTEROL SULFATE (2.5 MG/3ML) 0.083% IN NEBU: 2.5000 mg | INHALATION_SOLUTION | Freq: Four times a day (QID) | RESPIRATORY_TRACT | 0 refills | Status: AC | PRN

## 2019-10-21 MED ORDER — AZITHROMYCIN 250 MG PO TABS
ORAL_TABLET | ORAL | 0 refills | Status: DC
Start: 2019-10-21 — End: 2021-01-22

## 2019-10-21 NOTE — ED Provider Notes (Signed)
[facility] Emergency Department Provider Note  ____________________________________________  Time seen: Approximately 9:26 AM  I have reviewed the triage vital signs and the nursing notes.   HISTORY  Chief Complaint Cough    HPI Jessica Wong is a 34 y.o. female that presents to the emergency department for evaluation of nasal congestion, productive cough with phlegm for 1 week.  Patient states that 3 days ago, she developed some chest tightness.  She has a history of asthma and has been using her albuterol inhaler, which is not helping.  She has a nebulizer machine at home but no longer has any medication.  She has not had a Covid vaccination.  No fever, vomiting, abdominal pain, diarrhea.  Past Medical History:  Diagnosis Date  . Asthma   . Thyroid disease     Patient Active Problem List   Diagnosis Date Noted  . Labor and delivery indication for care or intervention 09/13/2016    Past Surgical History:  Procedure Laterality Date  . APPENDECTOMY    . CHOLECYSTECTOMY    . DILATION AND CURETTAGE OF UTERUS    . KNEE SURGERY Right     Prior to Admission medications   Medication Sig Start Date End Date Taking? Authorizing Provider  acetaminophen (TYLENOL) 500 MG tablet Take 1,000 mg by mouth every 6 (six) hours as needed.    [provider]  albuterol (PROVENTIL) (2.5 MG/3ML) 0.083% nebulizer solution Take 3 mLs (2.5 mg total) by nebulization every 6 (six) hours as needed for wheezing or shortness of breath. 10/21/19   Enid Derry, PA-C  azithromycin (ZITHROMAX Z-PAK) 250 MG tablet Take 2 tablets (500 mg) on  Day 1,  followed by 1 tablet (250 mg) once daily on Days 2 through 5. 10/21/19   Enid Derry, PA-C  benzonatate (TESSALON PERLES) 100 MG capsule Take 1 capsule (100 mg total) by mouth 3 (three) times daily as needed. 10/21/19 10/20/20  Enid Derry, PA-C    Allergies Metronidazole, Bactrim [sulfamethoxazole-trimethoprim], Tape,  and Tramadol  No family history on file.  Social History Social History   Tobacco Use  . Smoking status: Never Smoker  . Smokeless tobacco: Never Used  Substance Use Topics  . Alcohol use: No  . Drug use: No     Review of Systems  Constitutional: No fever/chills Eyes: No visual changes. No discharge. ENT: Positive for congestion and rhinorrhea. Cardiovascular: No chest pain. Respiratory: Positive for cough and SOB. Gastrointestinal: No abdominal pain.  No nausea, no vomiting.  No diarrhea.  No constipation. Musculoskeletal: Negative for musculoskeletal pain. Skin: Negative for rash, abrasions, lacerations, ecchymosis. Neurological: Negative for headaches.   ____________________________________________   PHYSICAL EXAM:  VITAL SIGNS: ED Triage Vitals  Enc Vitals Group     BP 10/21/19 0732 121/81     Pulse Rate 10/21/19 0732 70     Resp 10/21/19 0732 16     Temp 10/21/19 0732 98.2 F (36.8 C)     Temp Source 10/21/19 0732 Oral     SpO2 10/21/19 0732 99 %     Weight 10/21/19 0732 190 lb 0.6 oz (86.2 kg)     Height 10/21/19 0732 5\' 4"  (1.626 m)     Head Circumference --      Peak Flow --      Pain Score 10/21/19 0731 2     Pain Loc --      Pain Edu? --      Excl. in GC? --  Constitutional: Alert and oriented. Well appearing and in no acute distress. Eyes: Conjunctivae are normal. PERRL. EOMI. No discharge. Head: Atraumatic. ENT: No frontal and maxillary sinus tenderness.      Ears: Tympanic membranes pearly gray with good landmarks. No discharge.      Nose: Mild congestion/rhinnorhea.      Mouth/Throat: Mucous membranes are moist. Oropharynx non-erythematous. Tonsils not enlarged. No exudates. Uvula midline. Neck: No stridor.   Hematological/Lymphatic/Immunilogical: No cervical lymphadenopathy. Cardiovascular: Normal rate, regular rhythm.  Good peripheral circulation. Respiratory: Normal respiratory effort without tachypnea or retractions. Lungs CTAB.  Good air entry to the bases with no decreased or absent breath sounds. Gastrointestinal: Bowel sounds 4 quadrants. Soft and nontender to palpation. No guarding or rigidity. No palpable masses. No distention. Musculoskeletal: Full range of motion to all extremities. No gross deformities appreciated. Neurologic:  Normal speech and language. No gross focal neurologic deficits are appreciated.  Skin:  Skin is warm, dry and intact. No rash noted. Psychiatric: Mood and affect are normal. Speech and behavior are normal. Patient exhibits appropriate insight and judgement.   ____________________________________________   LABS (all labs ordered are listed, but only abnormal results are displayed)  Labs Reviewed  SARS CORONAVIRUS 2 (TAT 6-24 HRS)   ____________________________________________  EKG   ____________________________________________  RADIOLOGY Lexine Baton, personally viewed and evaluated these images (plain radiographs) as part of my medical decision making, as well as reviewing the written report by the radiologist.  DG Chest Portable 1 View  Result Date: 10/21/2019 CLINICAL DATA:  Patient with cough for 1 week. EXAM: PORTABLE CHEST 1 VIEW COMPARISON:  Chest radiograph 03/21/2018. FINDINGS: Stable cardiac and mediastinal contours. No consolidative pulmonary opacities. No pleural effusion or pneumothorax. IMPRESSION: No acute cardiopulmonary process. Electronically Signed   By: Annia Belt M.D.   On: 10/21/2019 09:15    ____________________________________________    PROCEDURES  Procedure(s) performed:    Procedures    Medications  ipratropium-albuterol (DUONEB) 0.5-2.5 (3) MG/3ML nebulizer solution 3 mL (3 mLs Nebulization Given 10/21/19 0946)     ____________________________________________   INITIAL IMPRESSION / ASSESSMENT AND PLAN / ED COURSE  Pertinent labs & imaging results that were available during my care of the patient were reviewed by me and  considered in my medical decision making (see chart for details).  Review of the [place] CSRS was performed in accordance of the [facility] prior to dispensing any controlled drugs.    Patient's diagnosis is consistent with URI. Vital signs and exam are reassuring.  Chest x-ray negative for acute cardiopulmonary processes.  Covid test is pending.  Chest tightness significantly improved following DuoNeb treatment.  Patient appears well and is staying well hydrated. Patient feels comfortable going home. Patient will be discharged home with prescriptions for azithromycin, Tessalon Perles, albuterol nebulizer solution. Patient is to follow up with PCP as needed or otherwise directed. Patient is given ED precautions to return to the ED for any worsening or new symptoms.  Jessica Wong was evaluated in Emergency Department on 10/21/2019 for the symptoms described in the history of present illness. She was evaluated in the context of the global COVID-19 pandemic, which necessitated consideration that the patient might be at risk for infection with the SARS-CoV-2 virus that causes COVID-19. Institutional protocols and algorithms that pertain to the evaluation of patients at risk for COVID-19 are in a state of rapid change based on information released by regulatory bodies including the CDC and federal and state organizations. These policies and algorithms were followed  during the patient's care in the ED.   ____________________________________________  FINAL CLINICAL IMPRESSION(S) / ED DIAGNOSES  Final diagnoses:  Upper respiratory tract infection, unspecified type      NEW MEDICATIONS STARTED DURING THIS VISIT:  ED Discharge Orders         Ordered    azithromycin (ZITHROMAX Z-PAK) 250 MG tablet     Discontinue  Reprint     10/21/19 1014    benzonatate (TESSALON PERLES) 100 MG capsule  3 times daily PRN     Discontinue  Reprint     10/21/19 1014    albuterol (PROVENTIL) (2.5 MG/3ML) 0.083% nebulizer  solution  Every 6 hours PRN     Discontinue  Reprint     10/21/19 1014              This chart was dictated using voice recognition software/Dragon. Despite best efforts to proofread, errors can occur which can change the meaning. Any change was purely unintentional.    Enid Derry, PA-C 10/21/19 1400    Willy Eddy, MD 10/21/19 1433

## 2019-10-21 NOTE — ED Triage Notes (Signed)
C/O cough x 1 week with worsening chest tightness with cough and feeling as if unable to take a full breath.  AAOx3.  Skin warm and dry.  No SOB/ DOE. WOB wnl. NAD

## 2019-10-21 NOTE — ED Notes (Addendum)
See triage note  Presents with cough for 1 week  Now states she is having a tight feeling in chest  No fever  Hx of asthma  Min to no relief inhaler

## 2019-10-22 ENCOUNTER — Telehealth: Payer: Self-pay | Admitting: Emergency Medicine

## 2019-10-22 NOTE — Telephone Encounter (Signed)
Called patient to assure she is aware of covid result.  She is aware. Explained cdc guidelines for self isolation and quarantine for close contacts.

## 2019-11-30 ENCOUNTER — Emergency Department: Payer: Self-pay

## 2019-11-30 ENCOUNTER — Encounter: Payer: Self-pay | Admitting: Emergency Medicine

## 2019-11-30 DIAGNOSIS — J45909 Unspecified asthma, uncomplicated: Secondary | ICD-10-CM | POA: Insufficient documentation

## 2019-11-30 DIAGNOSIS — M25551 Pain in right hip: Secondary | ICD-10-CM | POA: Insufficient documentation

## 2019-11-30 LAB — POCT PREGNANCY, URINE: Preg Test, Ur: NEGATIVE

## 2019-11-30 NOTE — ED Triage Notes (Signed)
C/o rt hip pain x3 days after hearing a "pop" when squatting down. Pt ambulatory but with limp and unsteady gait.

## 2019-11-30 NOTE — ED Notes (Signed)
NEGATIVE RESULT POC UA PREGNANCY

## 2019-12-01 ENCOUNTER — Emergency Department: Payer: Self-pay

## 2019-12-01 ENCOUNTER — Emergency Department
Admission: EM | Admit: 2019-12-01 | Discharge: 2019-12-01 | Disposition: A | Discharge status: Home / Self Care | Payer: Self-pay | Attending: Emergency Medicine | Admitting: Emergency Medicine

## 2019-12-01 DIAGNOSIS — M25551 Pain in right hip: Secondary | ICD-10-CM

## 2019-12-01 MED ORDER — OXYCODONE-ACETAMINOPHEN 5-325 MG PO TABS
1.0000 | ORAL_TABLET | Freq: Once | ORAL | Status: AC
  Administered 2019-12-01: 1 via ORAL
  Filled 2019-12-01: qty 1

## 2019-12-01 MED ORDER — OXYCODONE-ACETAMINOPHEN 5-325 MG PO TABS: 1.0000 | ORAL_TABLET | ORAL | 0 refills | Status: AC | PRN

## 2019-12-01 NOTE — ED Provider Notes (Signed)
[facility] Emergency Department Provider Note __________   First MD Initiated Contact with Patient 12/01/19 0114     (approximate)  I have reviewed the triage vital signs and the nursing notes.   HISTORY  Chief Complaint Hip Pain    HPI Jessica Wong is a 34 y.o. female with below list of previous medical conditions including asthma and thyroid disease presents to the emergency department secondary to nontraumatic right hip pain x3 days.  Patient states that while at work she squatted to put a bag of dog food on a lower shelf and felt a pop in her hip with subsequent pain ever since.  Patient states that she is able to ambulate however has considerable discomfort with doing so and has a limp as well.  Patient denies any fever afebrile on presentation.       Past Medical History:  Diagnosis Date  . Asthma   . Thyroid disease     Patient Active Problem List   Diagnosis Date Noted  . Labor and delivery indication for care or intervention 09/13/2016    Past Surgical History:  Procedure Laterality Date  . APPENDECTOMY    . CHOLECYSTECTOMY    . DILATION AND CURETTAGE OF UTERUS    . KNEE SURGERY Right     Prior to Admission medications   Medication Sig Start Date End Date Taking? Authorizing Provider  acetaminophen (TYLENOL) 500 MG tablet Take 1,000 mg by mouth every 6 (six) hours as needed.    [provider]  albuterol (PROVENTIL) (2.5 MG/3ML) 0.083% nebulizer solution Take 3 mLs (2.5 mg total) by nebulization every 6 (six) hours as needed for wheezing or shortness of breath. 10/21/19   Enid Derry, PA-C  azithromycin (ZITHROMAX Z-PAK) 250 MG tablet Take 2 tablets (500 mg) on  Day 1,  followed by 1 tablet (250 mg) once daily on Days 2 through 5. 10/21/19   Enid Derry, PA-C  benzonatate (TESSALON PERLES) 100 MG capsule Take 1 capsule (100 mg total) by mouth 3 (three) times daily as needed. 10/21/19 10/20/20  Enid Derry, PA-C    oxyCODONE-acetaminophen (PERCOCET) 5-325 MG tablet Take 1 tablet by mouth every 4 (four) hours as needed. 12/01/19 11/30/20  Darci Current, MD    Allergies Metronidazole, Bactrim [sulfamethoxazole-trimethoprim], Tape, and Tramadol  History reviewed. No pertinent family history.  Social History Social History   Tobacco Use  . Smoking status: Never Smoker  . Smokeless tobacco: Never Used  Substance Use Topics  . Alcohol use: No  . Drug use: No    Review of Systems Constitutional: No fever/chills Eyes: No visual changes. ENT: No sore throat. Cardiovascular: Denies chest pain. Respiratory: Denies shortness of breath. Gastrointestinal: No abdominal pain.  No nausea, no vomiting.  No diarrhea.  No constipation. Genitourinary: Negative for dysuria. Musculoskeletal:  Positive for right hip pain Integumentary: Negative for rash. Neurological: Negative for headaches, focal weakness or numbness.  ____________________________________________   PHYSICAL EXAM:  VITAL SIGNS: ED Triage Vitals  Enc Vitals Group     BP 11/30/19 2042 (!) 131/94     Pulse Rate 11/30/19 2042 96     Resp 11/30/19 2042 20     Temp 11/30/19 2042 98.3 F (36.8 C)     Temp Source 11/30/19 2042 Oral     SpO2 11/30/19 2042 100 %     Weight --      Height --      Head Circumference --  Peak Flow --      Pain Score 12/01/19 0115 7     Pain Loc --      Pain Edu? --      Excl. in GC? --     Constitutional: Alert and oriented.  Eyes: Conjunctivae are normal.  Head: Atraumatic. Mouth/Throat: Patient is wearing a mask. Neck: No stridor.  No meningeal signs.   Cardiovascular: Normal rate, regular rhythm. Good peripheral circulation. Grossly normal heart sounds. Respiratory: Normal respiratory effort.  No retractions. Gastrointestinal: Soft and nontender. No distention.  Musculoskeletal: Positive for right hip pain palpation and range of motion Neurologic:  Normal speech and language. No gross  focal neurologic deficits are appreciated.  Skin:  Skin is warm, dry and intact. Psychiatric: Mood and affect are normal. Speech and behavior are normal.  ____________________________________________   LABS (all labs ordered are listed, but only abnormal results are displayed)  Labs Reviewed  POC URINE PREG, ED  POCT PREGNANCY, URINE     RADIOLOGY I, Boone N Torry Adamczak, personally viewed and evaluated these images (plain radiographs) as part of my medical decision making, as well as reviewing the written report by the radiologist.  ED MD interpretation: Remarkable pelvis and right hip x-ray per radiologist.   Negative CT of the right hip per radiologist.  Official radiology report(s): CT Hip Right Wo Contrast  Result Date: 12/01/2019 CLINICAL DATA:  Right hip pain for several days, no known injury, initial encounter EXAM: CT OF THE RIGHT HIP WITHOUT CONTRAST TECHNIQUE: Multidetector CT imaging of the right hip was performed according to the standard protocol. Multiplanar CT image reconstructions were also generated. COMPARISON:  Plain film from earlier in the same day. FINDINGS: Bones/Joint/Cartilage Bony structures are well visualized. Mild degenerative changes of the pubic symphysis are seen. No acute fracture is noted. Ligaments Suboptimally assessed by CT. Muscles and Tendons Surrounding musculature is within normal limits. Soft tissues Soft tissue structures are unremarkable. IMPRESSION: No acute right hip fracture is noted. Electronically Signed   By: Alcide Clever M.D.   On: 12/01/2019 02:22   DG Hip Unilat W or Wo Pelvis 2-3 Views Right  Result Date: 11/30/2019 CLINICAL DATA:  Right hip pain for 3 days after squatting, altered gait EXAM: DG HIP (WITH OR WITHOUT PELVIS) 2-3V RIGHT COMPARISON:  None. FINDINGS: Frontal view of the pelvis as well as frontal and frogleg lateral views of the right hip are obtained. No fracture, subluxation, or dislocation. Joint spaces are well  preserved. The sacroiliac joints are normal. IMPRESSION: 1. Unremarkable pelvis and right hip. Electronically Signed   By: Sharlet Salina M.D.   On: 11/30/2019 21:23    _______________________________________ Procedures   ____________________________________________   INITIAL IMPRESSION / MDM / ASSESSMENT AND PLAN / ED COURSE  As part of my medical decision making, I reviewed the following data within the electronic MEDICAL RECORD NUMBER  34 year old female with above-stated history and physical exam secondary to right hip pain after squatting and hearing a pop".  X-ray of the right hip performed revealed no evidence of fracture dislocation or any gross pathology.  Given patient's stated 10 out of 10 right hip pain CT of the right hip performed which again revealed no acute pathology.  Patient will be referred to orthopedic surgery for further outpatient evaluation. ____________________________________  FINAL CLINICAL IMPRESSION(S) / ED DIAGNOSES  Final diagnoses:  Right hip pain     MEDICATIONS GIVEN DURING THIS VISIT:  Medications  oxyCODONE-acetaminophen (PERCOCET/ROXICET) 5-325 MG per tablet 1 tablet (1  tablet Oral Given 12/01/19 0224)     ED Discharge Orders         Ordered    oxyCODONE-acetaminophen (PERCOCET) 5-325 MG tablet  Every 4 hours PRN        12/01/19 0237          *Please note:  Jessica Wong was evaluated in Emergency Department on 12/01/2019 for the symptoms described in the history of present illness. She was evaluated in the context of the global COVID-19 pandemic, which necessitated consideration that the patient might be at risk for infection with the SARS-CoV-2 virus that causes COVID-19. Institutional protocols and algorithms that pertain to the evaluation of patients at risk for COVID-19 are in a state of rapid change based on information released by regulatory bodies including the CDC and federal and state organizations. These policies and algorithms were  followed during the patient's care in the ED.  Some ED evaluations and interventions may be delayed as a result of limited staffing during and after the pandemic.*  Note:  This document was prepared using Dragon voice recognition software and may include unintentional dictation errors.   Darci Current, MD 12/01/19 [phone]

## 2020-02-05 ENCOUNTER — Emergency Department
Admission: EM | Admit: 2020-02-05 | Discharge: 2020-02-06 | Disposition: A | Discharge status: Home / Self Care | Payer: Self-pay | Attending: Emergency Medicine | Admitting: Emergency Medicine

## 2020-02-05 ENCOUNTER — Emergency Department: Payer: Self-pay

## 2020-02-05 ENCOUNTER — Other Ambulatory Visit: Payer: Self-pay

## 2020-02-05 DIAGNOSIS — Z20822 Contact with and (suspected) exposure to covid-19: Secondary | ICD-10-CM | POA: Insufficient documentation

## 2020-02-05 DIAGNOSIS — N390 Urinary tract infection, site not specified: Secondary | ICD-10-CM | POA: Insufficient documentation

## 2020-02-05 DIAGNOSIS — R519 Headache, unspecified: Secondary | ICD-10-CM

## 2020-02-05 DIAGNOSIS — R109 Unspecified abdominal pain: Secondary | ICD-10-CM | POA: Insufficient documentation

## 2020-02-05 LAB — URINALYSIS, COMPLETE (UACMP) WITH MICROSCOPIC
Bacteria, UA: NONE SEEN
Bilirubin Urine: NEGATIVE
Glucose, UA: NEGATIVE mg/dL
Hgb urine dipstick: NEGATIVE
Ketones, ur: NEGATIVE mg/dL
Nitrite: NEGATIVE
Protein, ur: 30 mg/dL — AB
Specific Gravity, Urine: 1.029 (ref 1.005–1.030)
pH: 5 (ref 5.0–8.0)

## 2020-02-05 LAB — POC URINE PREG, ED: Preg Test, Ur: NEGATIVE

## 2020-02-05 MED ORDER — PROCHLORPERAZINE EDISYLATE 10 MG/2ML IJ SOLN
10.0000 mg | Freq: Once | INTRAMUSCULAR | Status: AC
  Administered 2020-02-05: 10 mg via INTRAVENOUS
  Filled 2020-02-05: qty 2

## 2020-02-05 MED ORDER — DIPHENHYDRAMINE HCL 50 MG/ML IJ SOLN
25.0000 mg | Freq: Once | INTRAMUSCULAR | Status: AC
  Administered 2020-02-05: 25 mg via INTRAVENOUS
  Filled 2020-02-05: qty 1

## 2020-02-05 MED ORDER — SODIUM CHLORIDE 0.9 % IV BOLUS
1000.0000 mL | Freq: Once | INTRAVENOUS | Status: AC
  Administered 2020-02-05: 1000 mL via INTRAVENOUS

## 2020-02-05 MED ORDER — KETOROLAC TROMETHAMINE 30 MG/ML IJ SOLN
10.0000 mg | Freq: Once | INTRAMUSCULAR | Status: AC
  Administered 2020-02-05: 9.9 mg via INTRAVENOUS
  Filled 2020-02-05: qty 1

## 2020-02-05 NOTE — ED Provider Notes (Signed)
[facility] Emergency Department Provider Note   ____________________________________________   First MD Initiated Contact with Patient 02/05/20 2332     (approximate)  I have reviewed the triage vital signs and the nursing notes.   HISTORY  Chief Complaint Headache    HPI Jessica Wong is a 34 y.o. female who presents to the ED from home with a chief complaint of headache and left kidney pain.  Patient reports a 3-day history of frontal headache.  Denies history of migraines.  Headache associated with sinus pain, nausea and photophobia.  Also complaining of left kidney pain for the same duration.  Denies fever, chills, cough, chest pain, shortness of breath, abdominal pain, vomiting or hematuria.  Patient is unvaccinated against COVID-19.     Past Medical History:  Diagnosis Date  . Asthma   . Thyroid disease     Patient Active Problem List   Diagnosis Date Noted  . Labor and delivery indication for care or intervention 09/13/2016    Past Surgical History:  Procedure Laterality Date  . APPENDECTOMY    . CHOLECYSTECTOMY    . DILATION AND CURETTAGE OF UTERUS    . KNEE SURGERY Right     Prior to Admission medications   Medication Sig Start Date End Date Taking? Authorizing Provider  acetaminophen (TYLENOL) 500 MG tablet Take 1,000 mg by mouth every 6 (six) hours as needed.    [provider]  albuterol (PROVENTIL) (2.5 MG/3ML) 0.083% nebulizer solution Take 3 mLs (2.5 mg total) by nebulization every 6 (six) hours as needed for wheezing or shortness of breath. 10/21/19   Enid Derry, PA-C  azithromycin (ZITHROMAX Z-PAK) 250 MG tablet Take 2 tablets (500 mg) on  Day 1,  followed by 1 tablet (250 mg) once daily on Days 2 through 5. 10/21/19   Enid Derry, PA-C  benzonatate (TESSALON PERLES) 100 MG capsule Take 1 capsule (100 mg total) by mouth 3 (three) times daily as needed. 10/21/19 10/20/20  Enid Derry, PA-C   oxyCODONE-acetaminophen (PERCOCET) 5-325 MG tablet Take 1 tablet by mouth every 4 (four) hours as needed. 12/01/19 11/30/20  Darci Current, MD  prochlorperazine (COMPAZINE) 10 MG tablet Take 1 tablet (10 mg total) by mouth every 6 (six) hours as needed for nausea (headache). 02/06/20   Irean Hong, MD    Allergies Metronidazole, Bactrim [sulfamethoxazole-trimethoprim], Tape, and Tramadol  No family history on file.  Social History Social History   Tobacco Use  . Smoking status: Never Smoker  . Smokeless tobacco: Never Used  Substance Use Topics  . Alcohol use: No  . Drug use: No    Review of Systems  Constitutional: No fever/chills Eyes: No visual changes. ENT: No sore throat. Cardiovascular: Denies chest pain. Respiratory: Denies shortness of breath. Gastrointestinal: Positive for left flank pain.  No abdominal pain.  No nausea, no vomiting.  No diarrhea.  No constipation. Genitourinary: Negative for dysuria. Musculoskeletal: Negative for back pain. Skin: Negative for rash. Neurological: Positive for headache. Negative for focal weakness or numbness.   ____________________________________________   PHYSICAL EXAM:  VITAL SIGNS: ED Triage Vitals  Enc Vitals Group     BP 02/05/20 1910 126/78     Pulse Rate 02/05/20 1910 (!) 106     Resp 02/05/20 1910 18     Temp 02/05/20 1910 100.1 F (37.8 C)     Temp src --      SpO2 02/05/20 1910 98 %     Weight 02/05/20 1911  170 lb (77.1 kg)     Height 02/05/20 1911 5\' 4"  (1.626 m)     Head Circumference --      Peak Flow --      Pain Score 02/05/20 1911 5     Pain Loc --      Pain Edu? --      Excl. in GC? --     Constitutional: Alert and oriented. Well appearing and in mild acute distress. Eyes: Conjunctivae are normal. PERRL. EOMI. Head: Atraumatic.  Frontal sinuses tender to palpation. Ears: Mild fluid behind both TMs. Nose: No congestion/rhinnorhea. Mouth/Throat: Mucous membranes are moist.   Neck: No  stridor.   Cardiovascular: Normal rate, regular rhythm. Grossly normal heart sounds.  Good peripheral circulation. Respiratory: Normal respiratory effort.  No retractions. Lungs CTAB. Gastrointestinal: Soft and nontender. No distention. No abdominal bruits. No CVA tenderness. Musculoskeletal: No lower extremity tenderness nor edema.  No joint effusions. Neurologic: Alert and oriented x3.  CN II-XII grossly intact.  Normal speech and language. No gross focal neurologic deficits are appreciated. No gait instability. Skin:  Skin is warm, dry and intact. No rash noted.  No petechiae.  No vesicles. Psychiatric: Mood and affect are normal. Speech and behavior are normal.  ____________________________________________   LABS (all labs ordered are listed, but only abnormal results are displayed)  Labs Reviewed  URINALYSIS, COMPLETE (UACMP) WITH MICROSCOPIC - Abnormal; Notable for the following components:      Result Value   Color, Urine YELLOW (*)    APPearance HAZY (*)    Protein, ur 30 (*)    Leukocytes,Ua TRACE (*)    All other components within normal limits  RESP PANEL BY RT-PCR (FLU A&B, COVID) ARPGX2  PREGNANCY, URINE  POC URINE PREG, ED   ____________________________________________  EKG  None ____________________________________________  RADIOLOGY I, Omer Puccinelli J, personally viewed and evaluated these images (plain radiographs) as part of my medical decision making, as well as reviewing the written report by the radiologist.  ED MD interpretation: No ICH  Official radiology report(s): CT Head Wo Contrast  Result Date: 02/06/2020 CLINICAL DATA:  Headache for 3 days. Previous history of headaches and migraines. EXAM: CT HEAD WITHOUT CONTRAST TECHNIQUE: Contiguous axial images were obtained from the base of the skull through the vertex without intravenous contrast. COMPARISON:  08/16/2013 FINDINGS: Brain: No evidence of acute infarction, hemorrhage, hydrocephalus, extra-axial  collection or mass lesion/mass effect. Vascular: No hyperdense vessel or unexpected calcification. Skull: Normal. Negative for fracture or focal lesion. Sinuses/Orbits: No acute finding. Other: No change since previous study. IMPRESSION: No acute intracranial abnormalities. Electronically Signed   By: 10/16/2013 M.D.   On: 02/06/2020 00:01    ____________________________________________   PROCEDURES  Procedure(s) performed (including Critical Care):  Procedures   ____________________________________________   INITIAL IMPRESSION / ASSESSMENT AND PLAN / ED COURSE  As part of my medical decision making, I reviewed the following data within the electronic MEDICAL RECORD NUMBER Nursing notes reviewed and incorporated, Labs reviewed, Old chart reviewed (OB visits, psychiatry visits), Notes from prior ED visits (multiple minor care visits) and [place] Controlled Substance Database     34 year old female presenting with headache and left flank pain. Differential diagnosis includes, but is not limited to, intracranial hemorrhage, meningitis/encephalitis, previous head trauma, cavernous venous thrombosis, tension headache, temporal arteritis, migraine or migraine equivalent, idiopathic intracranial hypertension, and non-specific headache.  Will obtain respiratory panel, CT head.  Slight improvement after Compazine given in triage.  Will add IV Benadryl and Toradol.  UA pending.  Clinical Course as of Feb 05 509  Wynelle Link Feb 06, 2020  0239 Headache improved.  Updated patient and spouse of laboratory and imaging results.  Dose of fosfomycin prior to discharge.  Will discharge home with prescription for Compazine to use as needed.  Strict return precautions given.  Both verbalized understanding and agree with plan of care.   [JS]    Clinical Course User Index [JS] Irean Hong, MD     ____________________________________________   FINAL CLINICAL IMPRESSION(S) / ED DIAGNOSES  Final diagnoses:   Urinary tract infection without hematuria, site unspecified  Acute nonintractable headache, unspecified headache type     ED Discharge Orders         Ordered    prochlorperazine (COMPAZINE) 10 MG tablet  Every 6 hours PRN        02/06/20 0240          *Please note:  Jessica Wong was evaluated in Emergency Department on 02/06/2020 for the symptoms described in the history of present illness. She was evaluated in the context of the global COVID-19 pandemic, which necessitated consideration that the patient might be at risk for infection with the SARS-CoV-2 virus that causes COVID-19. Institutional protocols and algorithms that pertain to the evaluation of patients at risk for COVID-19 are in a state of rapid change based on information released by regulatory bodies including the CDC and federal and state organizations. These policies and algorithms were followed during the patient's care in the ED.  Some ED evaluations and interventions may be delayed as a result of limited staffing during and the pandemic.*   Note:  This document was prepared using Dragon voice recognition software and may include unintentional dictation errors.   Irean Hong, MD 02/06/20 [phone]

## 2020-02-05 NOTE — ED Notes (Signed)
Transported to CT 

## 2020-02-05 NOTE — ED Triage Notes (Signed)
Pt states she has had a headache for 3 days. Pt denies history of headache and migraines. Pt states last taking motrin 2 hours ago. Pt also states she has "left kidney pain."

## 2020-02-06 LAB — PREGNANCY, URINE: Preg Test, Ur: NEGATIVE

## 2020-02-06 LAB — RESP PANEL BY RT-PCR (FLU A&B, COVID) ARPGX2
Influenza A by PCR: NEGATIVE
Influenza B by PCR: NEGATIVE
SARS Coronavirus 2 by RT PCR: NEGATIVE

## 2020-02-06 MED ORDER — PROCHLORPERAZINE MALEATE 10 MG PO TABS
10.0000 mg | ORAL_TABLET | Freq: Four times a day (QID) | ORAL | 0 refills | Status: DC | PRN
Start: 2020-02-06 — End: 2021-01-22

## 2020-02-06 MED ORDER — FOSFOMYCIN TROMETHAMINE 3 G PO PACK
3.0000 g | PACK | Freq: Once | ORAL | Status: AC
  Administered 2020-02-06: 3 g via ORAL
  Filled 2020-02-06: qty 3

## 2020-02-06 NOTE — Discharge Instructions (Signed)
You may take Compazine as needed for headache and nausea.  Return to the ER for worsening symptoms, persistent vomiting, difficulty breathing or other concerns. 

## 2020-10-06 ENCOUNTER — Other Ambulatory Visit: Payer: Self-pay

## 2020-10-06 ENCOUNTER — Emergency Department
Admission: EM | Admit: 2020-10-06 | Discharge: 2020-10-06 | Disposition: A | Discharge status: Home / Self Care | Payer: Self-pay | Attending: Emergency Medicine | Admitting: Emergency Medicine

## 2020-10-06 ENCOUNTER — Emergency Department: Payer: Self-pay

## 2020-10-06 DIAGNOSIS — S8991XA Unspecified injury of right lower leg, initial encounter: Secondary | ICD-10-CM | POA: Insufficient documentation

## 2020-10-06 DIAGNOSIS — X501XXA Overexertion from prolonged static or awkward postures, initial encounter: Secondary | ICD-10-CM | POA: Insufficient documentation

## 2020-10-06 DIAGNOSIS — Y9302 Activity, running: Secondary | ICD-10-CM | POA: Insufficient documentation

## 2020-10-06 DIAGNOSIS — J45909 Unspecified asthma, uncomplicated: Secondary | ICD-10-CM | POA: Insufficient documentation

## 2020-10-06 MED ORDER — MELOXICAM 7.5 MG PO TABS
15.0000 mg | ORAL_TABLET | Freq: Once | ORAL | Status: AC
  Administered 2020-10-06: 15 mg via ORAL
  Filled 2020-10-06: qty 2

## 2020-10-06 MED ORDER — MELOXICAM 15 MG PO TABS: 15.0000 mg | ORAL_TABLET | Freq: Every day | ORAL | 0 refills | Status: AC

## 2020-10-06 NOTE — ED Provider Notes (Signed)
[facility] Emergency Department Provider Note  ____________________________________________  Time seen: Approximately 9:09 PM  I have reviewed the triage vital signs and the nursing notes.   HISTORY  Chief Complaint Knee Pain    HPI LEEANNA Wong is a 35 y.o. female who presents the emergency department complaining of right knee pain.  Patient states that she was running to one of her children when she felt a pop in her right knee.  Patient has had previous meniscus injury and previous ACL injury to the knee.  She is had surgery for both.  Patient has no residual knee pain until this injury 5 days ago.  Patient is still been ambulating but has had increased pain and swelling and presents for evaluation.  No other injury or complaint.       Past Medical History:  Diagnosis Date   Asthma    Thyroid disease     Patient Active Problem List   Diagnosis Date Noted   Labor and delivery indication for care or intervention 09/13/2016    Past Surgical History:  Procedure Laterality Date   APPENDECTOMY     CHOLECYSTECTOMY     DILATION AND CURETTAGE OF UTERUS     KNEE SURGERY Right     Prior to Admission medications   Medication Sig Start Date End Date Taking? Authorizing Provider  meloxicam (MOBIC) 15 MG tablet Take 1 tablet (15 mg total) by mouth daily. 10/06/20  Yes Jetaime Pinnix, Delorise Royals, PA-C  acetaminophen (TYLENOL) 500 MG tablet Take 1,000 mg by mouth every 6 (six) hours as needed.    [provider]  albuterol (PROVENTIL) (2.5 MG/3ML) 0.083% nebulizer solution Take 3 mLs (2.5 mg total) by nebulization every 6 (six) hours as needed for wheezing or shortness of breath. 10/21/19   Enid Derry, PA-C  azithromycin (ZITHROMAX Z-PAK) 250 MG tablet Take 2 tablets (500 mg) on  Day 1,  followed by 1 tablet (250 mg) once daily on Days 2 through 5. 10/21/19   Enid Derry, PA-C  benzonatate (TESSALON PERLES) 100 MG capsule Take 1 capsule (100 mg  total) by mouth 3 (three) times daily as needed. 10/21/19 10/20/20  Enid Derry, PA-C  oxyCODONE-acetaminophen (PERCOCET) 5-325 MG tablet Take 1 tablet by mouth every 4 (four) hours as needed. 12/01/19 11/30/20  Darci Current, MD  prochlorperazine (COMPAZINE) 10 MG tablet Take 1 tablet (10 mg total) by mouth every 6 (six) hours as needed for nausea (headache). 02/06/20   Irean Hong, MD    Allergies Metronidazole, Bactrim [sulfamethoxazole-trimethoprim], Tape, and Tramadol  No family history on file.  Social History Social History   Tobacco Use   Smoking status: Never   Smokeless tobacco: Never  Substance Use Topics   Alcohol use: No   Drug use: No     Review of Systems  Constitutional: No fever/chills Eyes: No visual changes. No discharge ENT: No upper respiratory complaints. Cardiovascular: no chest pain. Respiratory: no cough. No SOB. Gastrointestinal: No abdominal pain.  No nausea, no vomiting.  No diarrhea.  No constipation. Musculoskeletal: Knee injury Skin: Negative for rash, abrasions, lacerations, ecchymosis. Neurological: Negative for headaches, focal weakness or numbness.  10 System ROS otherwise negative.  ____________________________________________   PHYSICAL EXAM:  VITAL SIGNS: ED Triage Vitals [10/06/20 1903]  Enc Vitals Group     BP (!) 141/87     Pulse Rate 63     Resp 16     Temp 98.5 F (36.9 C)  Temp Source Oral     SpO2 100 %     Weight 170 lb (77.1 kg)     Height 5\' 4"  (1.626 m)     Head Circumference      Peak Flow      Pain Score 7     Pain Loc      Pain Edu?      Excl. in GC?      Constitutional: Alert and oriented. Well appearing and in no acute distress. Eyes: Conjunctivae are normal. PERRL. EOMI. Head: Atraumatic. ENT:      Ears:       Nose: No congestion/rhinnorhea.      Mouth/Throat: Mucous membranes are moist.  Neck: No stridor.    Cardiovascular: Normal rate, regular rhythm. Normal S1 and S2.  Good peripheral  circulation. Respiratory: Normal respiratory effort without tachypnea or retractions. Lungs CTAB. Good air entry to the bases with no decreased or absent breath sounds. Musculoskeletal: Full range of motion to all extremities. No gross deformities appreciated.  Visualization of the right knee reveals no soft tissue injuries of lacerations or abrasions.  Patient does have some edema of the right knee when compared with left.  Patient is still able to flex and extend the knee at this time.  Positive for ballottement in the suprapatellar region.  Tender along the medial aspect of the knee and over the medial joint line.  No significant tenderness over the lateral joint line or lateral knee.  Special tests of the knee reveal no significant laxity with varus, valgus or Lachman's.  Dorsalis pedis pulse intact distally.  Sensation intact distally. Neurologic:  Normal speech and language. No gross focal neurologic deficits are appreciated.  Skin:  Skin is warm, dry and intact. No rash noted. Psychiatric: Mood and affect are normal. Speech and behavior are normal. Patient exhibits appropriate insight and judgement.   ____________________________________________   LABS (all labs ordered are listed, but only abnormal results are displayed)  Labs Reviewed - No data to display ____________________________________________  EKG   ____________________________________________  RADIOLOGY I personally viewed and evaluated these images as part of my medical decision making, as well as reviewing the written report by the radiologist.  ED Provider Interpretation: Findings consistent with joint effusion.  No acute focal bony abnormality  DG Knee Complete 4 Views Right  Result Date: 10/06/2020 CLINICAL DATA:  Injury. Knee pain since Sunday. In wall running. Increased pain and swelling. EXAM: RIGHT KNEE - COMPLETE 4+ VIEW COMPARISON:  X-ray right knee 08/24/2015 FINDINGS: No evidence of fracture or dislocation.  Joint effusion. No evidence of arthropathy or other focal bone abnormality. Removal of prior, likely ACL repair, surgical hardware. Soft tissues are unremarkable. IMPRESSION: Joint effusion with no acute displaced fracture or dislocation. Electronically Signed   By: 08/26/2015 M.D.   On: 10/06/2020 19:41    ____________________________________________    PROCEDURES  Procedure(s) performed:    Procedures    Medications  meloxicam (MOBIC) tablet 15 mg (has no administration in time range)     ____________________________________________   INITIAL IMPRESSION / ASSESSMENT AND PLAN / ED COURSE  Pertinent labs & imaging results that were available during my care of the patient were reviewed by me and considered in my medical decision making (see chart for details).  Review of the [place] CSRS was performed in accordance of the [facility] prior to dispensing any controlled drugs.           Patient's diagnosis is consistent with the injury.  Patient presents the emergency department right knee pain and swelling.  Patient has had previous ACL injury as well as a previous meniscus injury both repaired surgically.  Patient injured her knee 5 days ago, still been ambulating on scene but has ongoing swelling and pain.  Special tests were largely reassuring.  X-ray reveals joint effusion but no significant bony abnormality.  Given this my concern still remains for ligamentous or meniscal injury though this is less likely than knee sprain.  However given the previous injuries I do feel that the patient would benefit from immobilization, medication and follow-up with orthopedics. Patient is given ED precautions to return to the ED for any worsening or new symptoms.     ____________________________________________  FINAL CLINICAL IMPRESSION(S) / ED DIAGNOSES  Final diagnoses:  Injury of right knee, initial encounter      NEW MEDICATIONS STARTED DURING THIS VISIT:  ED Discharge Orders           Ordered    meloxicam (MOBIC) 15 MG tablet  Daily        10/06/20 2116                This chart was dictated using voice recognition software/Dragon. Despite best efforts to proofread, errors can occur which can change the meaning. Any change was purely unintentional.    Racheal Patches, PA-C 10/06/20 2118    Merwyn Katos, MD 10/06/20 2329

## 2020-10-06 NOTE — ED Triage Notes (Signed)
Pt states right iknee pain since Sunday. Pt states began while running. Pt complains of increased pain and swelling to knee.

## 2020-10-06 NOTE — ED Notes (Signed)
Pt requested to keep knee immobilizer in box until she gets home d/t driving home.

## 2021-01-22 ENCOUNTER — Emergency Department: Payer: Self-pay

## 2021-01-22 ENCOUNTER — Emergency Department
Admission: EM | Admit: 2021-01-22 | Discharge: 2021-01-22 | Disposition: A | Discharge status: Home / Self Care | Payer: Self-pay | Attending: Emergency Medicine | Admitting: Emergency Medicine

## 2021-01-22 ENCOUNTER — Other Ambulatory Visit: Payer: Self-pay

## 2021-01-22 DIAGNOSIS — J45909 Unspecified asthma, uncomplicated: Secondary | ICD-10-CM | POA: Insufficient documentation

## 2021-01-22 DIAGNOSIS — R109 Unspecified abdominal pain: Secondary | ICD-10-CM | POA: Insufficient documentation

## 2021-01-22 DIAGNOSIS — R110 Nausea: Secondary | ICD-10-CM | POA: Insufficient documentation

## 2021-01-22 DIAGNOSIS — M546 Pain in thoracic spine: Secondary | ICD-10-CM | POA: Insufficient documentation

## 2021-01-22 LAB — POC URINE PREG, ED: Preg Test, Ur: NEGATIVE

## 2021-01-22 LAB — URINALYSIS, ROUTINE W REFLEX MICROSCOPIC
Bilirubin Urine: NEGATIVE
Glucose, UA: NEGATIVE mg/dL
Hgb urine dipstick: NEGATIVE
Ketones, ur: NEGATIVE mg/dL
Nitrite: NEGATIVE
Protein, ur: NEGATIVE mg/dL
Specific Gravity, Urine: 1.011 (ref 1.005–1.030)
pH: 5 (ref 5.0–8.0)

## 2021-01-22 NOTE — Discharge Instructions (Addendum)
Your urine sample did not show any evidence of infection and your CAT scan did not show a kidney stone or any other findings that would explain your pain.  It is possible that your flank pain is due to muscle spasm/strain.  You can continue to take ibuprofen for the pain.  If you are developing fevers or worsening pain, please return to the emergency department.  Otherwise please follow-up with your primary care physician if symptoms continue.

## 2021-01-22 NOTE — ED Triage Notes (Signed)
First Nurse Note:  C/O left mid/lower back pain x 1 week. Denies injury.

## 2021-01-22 NOTE — ED Triage Notes (Signed)
Pt comes with c/o right flank pain for about week. Pt states some pain and difficulty when urinating. Pt denies any burning.

## 2021-01-22 NOTE — ED Provider Notes (Signed)
[facility]  ____________________________________________   Event Date/Time   First MD Initiated Contact with Patient 01/22/21 1220     (approximate)  I have reviewed the triage vital signs and the nursing notes.   HISTORY  Chief Complaint Flank Pain    HPI Jessica Wong is a 35 y.o. female with past medical history of asthma and thyroid disease who presents with left flank pain.  Symptoms started about a week ago.  Pain is located in the left upper back/flank does not radiate.  She had associated nausea and difficulty urinating but denies frank dysuria.  No vomiting, fevers or chills.  She denies any history of kidney stones.  Has had UTIs in the past but did not feel like this.  Denies cough shortness of breath or chest pain.         Past Medical History:  Diagnosis Date   Asthma    Thyroid disease     Patient Active Problem List   Diagnosis Date Noted   Labor and delivery indication for care or intervention 09/13/2016    Past Surgical History:  Procedure Laterality Date   APPENDECTOMY     CHOLECYSTECTOMY     DILATION AND CURETTAGE OF UTERUS     KNEE SURGERY Right     Prior to Admission medications   Medication Sig Start Date End Date Taking? Authorizing Provider  acetaminophen (TYLENOL) 500 MG tablet Take 1,000 mg by mouth every 6 (six) hours as needed.    [provider]  albuterol (PROVENTIL) (2.5 MG/3ML) 0.083% nebulizer solution Take 3 mLs (2.5 mg total) by nebulization every 6 (six) hours as needed for wheezing or shortness of breath. 10/21/19   Enid Derry, PA-C  meloxicam (MOBIC) 15 MG tablet Take 1 tablet (15 mg total) by mouth daily. 10/06/20   Cuthriell, Delorise Royals, PA-C    Allergies Metronidazole, Bactrim [sulfamethoxazole-trimethoprim], Tape, and Tramadol  No family history on file.  Social History Social History   Tobacco Use   Smoking status: Never   Smokeless tobacco: Never  Substance Use Topics    Alcohol use: No   Drug use: No    Review of Systems   Review of Systems  Constitutional:  Negative for chills and fever.  Respiratory:  Negative for cough and shortness of breath.   Gastrointestinal:  Positive for nausea. Negative for abdominal pain and vomiting.  Genitourinary:  Positive for difficulty urinating and flank pain. Negative for dysuria and vaginal discharge.  All other systems reviewed and are negative.  Physical Exam Updated Vital Signs BP (!) 135/93   Pulse 81   Temp 98.2 F (36.8 C) (Oral)   Resp 18   Ht 5\' 4"  (1.626 m)   Wt 77.1 kg   SpO2 100%   BMI 29.18 kg/m   Physical Exam Vitals and nursing note reviewed.  Constitutional:      General: She is not in acute distress.    Appearance: Normal appearance.  HENT:     Head: Normocephalic and atraumatic.  Eyes:     General: No scleral icterus.    Conjunctiva/sclera: Conjunctivae normal.  Pulmonary:     Effort: Pulmonary effort is normal. No respiratory distress.     Breath sounds: No stridor.  Abdominal:     General: Abdomen is flat. There is no distension.     Palpations: Abdomen is soft.     Tenderness: There is no abdominal tenderness. There is left CVA tenderness.  Musculoskeletal:  General: No deformity or signs of injury.     Cervical back: Normal range of motion.  Skin:    General: Skin is dry.     Coloration: Skin is not jaundiced or pale.  Neurological:     General: No focal deficit present.     Mental Status: She is alert and oriented to person, place, and time. Mental status is at baseline.  Psychiatric:        Mood and Affect: Mood normal.        Behavior: Behavior normal.     LABS (all labs ordered are listed, but only abnormal results are displayed)  Labs Reviewed  URINALYSIS, ROUTINE W REFLEX MICROSCOPIC - Abnormal; Notable for the following components:      Result Value   Color, Urine YELLOW (*)    APPearance HAZY (*)    Leukocytes,Ua TRACE (*)    Bacteria, UA RARE  (*)    All other components within normal limits  POC URINE PREG, ED   ____________________________________________  EKG  N/a ____________________________________________  RADIOLOGY Ky Barban, personally viewed and evaluated these images (plain radiographs) as part of my medical decision making, as well as reviewing the written report by the radiologist.  ED MD interpretation:  I reviewed the CT renal study which is negative for stones    ____________________________________________   PROCEDURES  Procedure(s) performed (including Critical Care):  Procedures   ____________________________________________   INITIAL IMPRESSION / ASSESSMENT AND PLAN / ED COURSE   35 year old female presents with left flank pain x1 week.  Her only other symptom is some nausea and difficulty urinating.  Vital signs within normal limits.  She is very well-appearing.  Does have some left CVA tenderness, abdomen is benign.  Currently UA and hCG are pending.  Differential includes pyelo, nephrolithiasis, musculoskeletal.  Pts UA not suggestive of infection, 0-45 WBCs, nitrite negative. CT renal does not show stone, there is some findings suggestive of enteritis.  Still unclear what is causing patient's pain.  May be musculoskeletal in etiology.  Will discharge with return precautions.  Advised that she follow-up with primary care if symptoms continue.   Clinical Course as of 01/22/21 1451  Mon Jan 22, 2021  1359 Hcg negative  [KM]  1404 Preg Test, Ur: NEGATIVE [KM]    Clinical Course User Index [KM] Georga Hacking, MD     ____________________________________________   FINAL CLINICAL IMPRESSION(S) / ED DIAGNOSES  Final diagnoses:  Flank pain     ED Discharge Orders     None        Note:  This document was prepared using Dragon voice recognition software and may include unintentional dictation errors.    Georga Hacking, MD 01/22/21 [phone]

## 2022-03-05 ENCOUNTER — Other Ambulatory Visit: Payer: Self-pay

## 2022-03-05 ENCOUNTER — Emergency Department
Admission: EM | Admit: 2022-03-05 | Discharge: 2022-03-05 | Disposition: A | Discharge status: Home / Self Care | Payer: Self-pay | Attending: Emergency Medicine | Admitting: Emergency Medicine

## 2022-03-05 DIAGNOSIS — J101 Influenza due to other identified influenza virus with other respiratory manifestations: Secondary | ICD-10-CM

## 2022-03-05 DIAGNOSIS — Z1152 Encounter for screening for COVID-19: Secondary | ICD-10-CM | POA: Insufficient documentation

## 2022-03-05 LAB — URINALYSIS, ROUTINE W REFLEX MICROSCOPIC
Bacteria, UA: NONE SEEN
Bilirubin Urine: NEGATIVE
Glucose, UA: NEGATIVE mg/dL
Ketones, ur: NEGATIVE mg/dL
Nitrite: NEGATIVE
Protein, ur: NEGATIVE mg/dL
Specific Gravity, Urine: 1.012 (ref 1.005–1.030)
pH: 6 (ref 5.0–8.0)

## 2022-03-05 LAB — RESP PANEL BY RT-PCR (RSV, FLU A&B, COVID)  RVPGX2
Influenza A by PCR: POSITIVE — AB
Influenza B by PCR: NEGATIVE
Resp Syncytial Virus by PCR: NEGATIVE
SARS Coronavirus 2 by RT PCR: NEGATIVE

## 2022-03-05 LAB — POC URINE PREG, ED: Preg Test, Ur: NEGATIVE

## 2022-03-05 NOTE — ED Provider Notes (Signed)
[facility] Provider Note    Event Date/Time   First MD Initiated Contact with Patient 03/05/22 1359     (approximate)   History   Emesis and Fever (Pt. To ED POV for fever, n/v/d with cough congestion x1.5 weeks with new onset low back pain yesterday. Pt's daughter ill with same.)   HPI  Jessica Wong is a 36 y.o. female with no reported past medical history presents today for approximately 1 week of cough, nasal congestion, and bodyaches.  She thought that her kidneys were hurting but she denies any dysuria.  She is unsure if she has had any fevers.  Her daughter is sick with the same symptoms.  She did not receive a flu shot this year.  Patient Active Problem List   Diagnosis Date Noted   Labor and delivery indication for care or intervention 09/13/2016          Physical Exam   Triage Vital Signs: ED Triage Vitals [03/05/22 1235]  Enc Vitals Group     BP (!) 142/91     Pulse Rate 88     Resp 18     Temp 98.8 F (37.1 C)     Temp Source Oral     SpO2 100 %     Weight      Height      Head Circumference      Peak Flow      Pain Score      Pain Loc      Pain Edu?      Excl. in [place]?     Most recent vital signs: Vitals:   03/05/22 1235  BP: (!) 142/91  Pulse: 88  Resp: 18  Temp: 98.8 F (37.1 C)  SpO2: 100%    Physical Exam Vitals and nursing note reviewed.  Constitutional:      General: Awake and alert. No acute distress.    Appearance: Normal appearance. The patient is normal weight.  HENT:     Head: Normocephalic and atraumatic.     Mouth: Mucous membranes are moist.  Nasal congestion present Eyes:     General: PERRL. Normal EOMs        Right eye: No discharge.        Left eye: No discharge.     Conjunctiva/sclera: Conjunctivae normal.  Cardiovascular:     Rate and Rhythm: Normal rate and regular rhythm.     Pulses: Normal pulses.     Heart sounds: Normal heart sounds Pulmonary:     Effort: Pulmonary effort is  normal. No respiratory distress.  Able to speak easily in complete sentences    Breath sounds: Normal breath sounds.  Abdominal:     Abdomen is soft. There is no abdominal tenderness. No rebound or guarding. No distention.  No CVA tenderness Musculoskeletal:        General: No swelling. Normal range of motion.     Cervical back: Normal range of motion and neck supple.  No lymphadenopathy Skin:    General: Skin is warm and dry.     Capillary Refill: Capillary refill takes less than 2 seconds.     Findings: No rash.  Neurological:     Mental Status: The patient is awake and alert.      ED Results / Procedures / Treatments   Labs (all labs ordered are listed, but only abnormal results are displayed) Labs Reviewed  RESP PANEL BY RT-PCR (RSV, FLU A&B, COVID)  RVPGX2 -  Abnormal; Notable for the following components:      Result Value   Influenza A by PCR POSITIVE (*)    All other components within normal limits  URINALYSIS, ROUTINE W REFLEX MICROSCOPIC - Abnormal; Notable for the following components:   Color, Urine YELLOW (*)    APPearance CLEAR (*)    Hgb urine dipstick SMALL (*)    Leukocytes,Ua SMALL (*)    All other components within normal limits  POC URINE PREG, ED     EKG     RADIOLOGY     PROCEDURES:  Critical Care performed:   Procedures   MEDICATIONS ORDERED IN ED: Medications - No data to display   IMPRESSION / MDM / ASSESSMENT AND PLAN / ED COURSE  I reviewed the triage vital signs and the nursing notes.   Differential diagnosis includes, but is not limited to, UTI/pyelonephritis, influenza, COVID-19.  Patient is awake and alert, hemodynamically stable and afebrile.  She has no reproducible abdominal tenderness or CVA tenderness on exam.  Urinalysis was obtained and is clear, no leukocytes or nitrites to suggest UTI.  No CVA tenderness on exam.  Not consistent with UTI/pyelonephritis.  Lungs are clear to auscultation bilaterally, no increased work of  breathing.  Do not suspect pneumonia.  Swab obtained with positive for influenza A.  I discussed this result with the patient.  She is out of the window for treatment with Tamiflu which she understands and agrees.  We discussed Tylenol and ibuprofen.  She was given a work note as requested.  We discussed return precautions and the importance of close outpatient follow-up.  We also discussed symptomatic management.  Also discussed that she is highly contagious to others.  Patient understands and agrees with plan.  She was discharged in stable condition.   Patient's presentation is most consistent with acute complicated illness / injury requiring diagnostic workup.    FINAL CLINICAL IMPRESSION(S) / ED DIAGNOSES   Final diagnoses:  Influenza A     Rx / DC Orders   ED Discharge Orders     None        Note:  This document was prepared using Dragon voice recognition software and may include unintentional dictation errors.   Keturah Shavers 03/05/22 1622    Georga Hacking, MD 03/12/22 [phone]

## 2022-03-05 NOTE — Discharge Instructions (Addendum)
You have tested positive for the flu.  You may take Tylenol/ibuprofen per package instructions to help with your symptoms.  You are highly contagious to others.  You are given a work note.  Please return for any new, worsening, or change in symptoms or other concerns.  It was a pleasure caring for you today.

## 2022-03-05 NOTE — ED Triage Notes (Signed)
Pt. To ED POV for fever, n/v/d with cough congestion x1.5 weeks with new onset low back pain yesterday. Pt's daughter ill with same.

## 2022-03-05 NOTE — ED Provider Triage Note (Signed)
Emergency Medicine Provider Triage Evaluation Note  Jessica Wong , a 36 y.o. female  was evaluated in triage.  Pt complains of fever, body aches for 1 week. Reports that she has "kidney pain." No dysuria. Daughter is sick with similar symptoms. No flu shot this year.  Review of Systems  Positive: Fever, myalgias Negative: Abd pain  Physical Exam  There were no vitals taken for this visit. Gen:   Awake, no distress   Resp:  Normal effort  MSK:   Moves extremities without difficulty  Other:    Medical Decision Making  Medically screening exam initiated at 12:27 PM.  Appropriate orders placed.  [name] was informed that the remainder of the evaluation will be completed by another provider, this initial triage assessment does not replace that evaluation, and the importance of remaining in the ED until their evaluation is complete.     Jackelyn Hoehn, PA-C 03/05/22 1230

## 2022-12-16 ENCOUNTER — Other Ambulatory Visit: Payer: Self-pay | Admitting: Family Medicine

## 2022-12-16 DIAGNOSIS — N911 Secondary amenorrhea: Secondary | ICD-10-CM

## 2023-02-25 ENCOUNTER — Encounter: Payer: Self-pay | Admitting: Emergency Medicine

## 2023-02-25 ENCOUNTER — Emergency Department
Admission: EM | Admit: 2023-02-25 | Discharge: 2023-02-25 | Disposition: A | Discharge status: Home / Self Care | Payer: Self-pay | Attending: Emergency Medicine | Admitting: Emergency Medicine

## 2023-02-25 ENCOUNTER — Other Ambulatory Visit: Payer: Self-pay

## 2023-02-25 DIAGNOSIS — J020 Streptococcal pharyngitis: Secondary | ICD-10-CM | POA: Insufficient documentation

## 2023-02-25 DIAGNOSIS — M791 Myalgia, unspecified site: Secondary | ICD-10-CM | POA: Insufficient documentation

## 2023-02-25 DIAGNOSIS — Z20822 Contact with and (suspected) exposure to covid-19: Secondary | ICD-10-CM | POA: Insufficient documentation

## 2023-02-25 DIAGNOSIS — J029 Acute pharyngitis, unspecified: Secondary | ICD-10-CM

## 2023-02-25 DIAGNOSIS — J45909 Unspecified asthma, uncomplicated: Secondary | ICD-10-CM | POA: Insufficient documentation

## 2023-02-25 LAB — RESP PANEL BY RT-PCR (RSV, FLU A&B, COVID)  RVPGX2
Influenza A by PCR: NEGATIVE
Influenza B by PCR: NEGATIVE
Resp Syncytial Virus by PCR: NEGATIVE
SARS Coronavirus 2 by RT PCR: NEGATIVE

## 2023-02-25 LAB — GROUP A STREP BY PCR: Group A Strep by PCR: DETECTED — AB

## 2023-02-25 MED ORDER — MAGIC MOUTHWASH
10.0000 mL | Freq: Once | ORAL | Status: AC
  Administered 2023-02-25: 10 mL via ORAL
  Filled 2023-02-25: qty 10

## 2023-02-25 MED ORDER — DEXAMETHASONE 6 MG PO TABS
10.0000 mg | ORAL_TABLET | Freq: Once | ORAL | Status: AC
  Administered 2023-02-25: 10 mg via ORAL
  Filled 2023-02-25: qty 1

## 2023-02-25 MED ORDER — AMOXICILLIN 500 MG PO CAPS: 500.0000 mg | ORAL_CAPSULE | Freq: Three times a day (TID) | ORAL | 0 refills | Status: DC

## 2023-02-25 MED ORDER — KETOROLAC TROMETHAMINE 60 MG/2ML IM SOLN
30.0000 mg | Freq: Once | INTRAMUSCULAR | Status: AC
  Administered 2023-02-25: 30 mg via INTRAMUSCULAR
  Filled 2023-02-25: qty 2

## 2023-02-25 MED ORDER — AMOXICILLIN 500 MG PO CAPS
500.0000 mg | ORAL_CAPSULE | Freq: Once | ORAL | Status: AC
  Administered 2023-02-25: 500 mg via ORAL
  Filled 2023-02-25: qty 1

## 2023-02-25 MED ORDER — MAGIC MOUTHWASH: ORAL | 0 refills | Status: AC

## 2023-02-25 NOTE — ED Provider Notes (Signed)
[facility] Provider Note    Event Date/Time   First MD Initiated Contact with Patient 02/25/23 [phone]     (approximate)   History   Sore Throat   HPI  Jessica Wong is a 37 y.o. female who presents to the ED from home with a 1 day history of fever, body aches sore throat.  Denies chest pain, shortness of breath, abdominal pain, nausea, vomiting or dizziness.     Past Medical History   Past Medical History:  Diagnosis Date   Asthma    Thyroid disease      Active Problem List   Patient Active Problem List   Diagnosis Date Noted   Labor and delivery indication for care or intervention 09/13/2016     Past Surgical History   Past Surgical History:  Procedure Laterality Date   APPENDECTOMY     CHOLECYSTECTOMY     DILATION AND CURETTAGE OF UTERUS     KNEE SURGERY Right      Home Medications   Prior to Admission medications   Medication Sig Start Date End Date Taking? Authorizing Provider  amoxicillin (AMOXIL) 500 MG capsule Take 1 capsule (500 mg total) by mouth 3 (three) times daily. 02/25/23  Yes Irean Hong, MD  magic mouthwash SOLN 12mL Anbesol 30mL Benadryl 30mL Mylanta  5mL swish, gargle & spit q8hr prn throat discomfort 02/25/23  Yes Irean Hong, MD  acetaminophen (TYLENOL) 500 MG tablet Take 1,000 mg by mouth every 6 (six) hours as needed.    [provider]  albuterol (PROVENTIL) (2.5 MG/3ML) 0.083% nebulizer solution Take 3 mLs (2.5 mg total) by nebulization every 6 (six) hours as needed for wheezing or shortness of breath. 10/21/19   Enid Derry, PA-C  meloxicam (MOBIC) 15 MG tablet Take 1 tablet (15 mg total) by mouth daily. 10/06/20   Cuthriell, Delorise Royals, PA-C     Allergies  Metronidazole, Bactrim [sulfamethoxazole-trimethoprim], Tape, and Tramadol   Family History  History reviewed. No pertinent family history.   Physical Exam  Triage Vital Signs: ED Triage Vitals  Encounter Vitals Group     BP  02/25/23 0355 119/88     Systolic BP Percentile --      Diastolic BP Percentile --      Pulse Rate 02/25/23 0355 87     Resp 02/25/23 0355 20     Temp 02/25/23 0355 98.2 F (36.8 C)     Temp Source 02/25/23 0355 Oral     SpO2 02/25/23 0355 100 %     Weight 02/25/23 0353 270 lb (122.5 kg)     Height 02/25/23 0353 5\' 4"  (1.626 m)     Head Circumference --      Peak Flow --      Pain Score 02/25/23 0353 7     Pain Loc --      Pain Education --      Exclude from Growth Chart --     Updated Vital Signs: BP 114/82   Pulse 88   Temp 98.4 F (36.9 C)   Resp 18   Ht 5\' 4"  (1.626 m)   Wt 122.5 kg   LMP 02/11/2023 (Approximate)   SpO2 100%   BMI 46.35 kg/m    General: Awake, no distress.  CV:  RRR.  Good peripheral perfusion.  Resp:  Normal effort.  TAB. Abd:  No distention.  Other:  Moderately erythematous posterior oropharynx with symmetrically bilateral tonsillar swelling with exudates, no peritonsillar  abscess.  There is no hoarse or muffled voice.  There is no drooling.  Shotty anterior cervical lymphadenopathy.   ED Results / Procedures / Treatments  Labs (all labs ordered are listed, but only abnormal results are displayed) Labs Reviewed  GROUP A STREP BY PCR - Abnormal; Notable for the following components:      Result Value   Group A Strep by PCR DETECTED (*)    All other components within normal limits  RESP PANEL BY RT-PCR (RSV, FLU A&B, COVID)  RVPGX2     EKG  None   RADIOLOGY None   Official radiology report(s): No results found.   PROCEDURES:  Critical Care performed: No  Procedures   MEDICATIONS ORDERED IN ED: Medications  dexamethasone (DECADRON) tablet 10 mg (10 mg Oral Given 02/25/23 0625)  amoxicillin (AMOXIL) capsule 500 mg (500 mg Oral Given 02/25/23 0625)  magic mouthwash (10 mLs Oral Given 02/25/23 0625)  ketorolac (TORADOL) injection 30 mg (30 mg Intramuscular Given 02/25/23 0625)     IMPRESSION / MDM / ASSESSMENT AND PLAN  / ED COURSE  I reviewed the triage vital signs and the nursing notes.                             37 year old female presenting with sore throat.  She is positive for strep.  Will treat with antibiotics, single dose Decadron, Magic mouthwash.  IM Ketorolac given in the ED.  Strict return precautions given.  Patient verbalizes understanding and agrees with plan of care.  Patient's presentation is most consistent with acute, uncomplicated illness.   FINAL CLINICAL IMPRESSION(S) / ED DIAGNOSES   Final diagnoses:  Sore throat  Strep throat     Rx / DC Orders   ED Discharge Orders          Ordered    amoxicillin (AMOXIL) 500 MG capsule  3 times daily        02/25/23 0625    magic mouthwash SOLN        02/25/23 4098             Note:  This document was prepared using Dragon voice recognition software and may include unintentional dictation errors.   Irean Hong, MD 02/25/23 [phone]

## 2023-02-25 NOTE — Discharge Instructions (Signed)
Take and finish antibiotic as prescribed.  You may use a wash as needed for discomfort.  Drink plenty of fluids daily.  Return to the ER for worsening symptoms, persistent vomiting, difficulty breathing or other concerns.

## 2023-02-25 NOTE — ED Triage Notes (Signed)
Patient ambulatory to triage with steady gait, without difficulty or distress noted; st since yesterday having fever, body aches and sore throat

## 2023-11-24 ENCOUNTER — Other Ambulatory Visit: Payer: Self-pay

## 2023-11-24 ENCOUNTER — Emergency Department
Admission: EM | Admit: 2023-11-24 | Discharge: 2023-11-24 | Disposition: A | Discharge status: Home / Self Care | Payer: MEDICAID | Attending: Emergency Medicine | Admitting: Emergency Medicine

## 2023-11-24 DIAGNOSIS — M25531 Pain in right wrist: Secondary | ICD-10-CM | POA: Insufficient documentation

## 2023-11-24 DIAGNOSIS — J45909 Unspecified asthma, uncomplicated: Secondary | ICD-10-CM | POA: Insufficient documentation

## 2023-11-24 MED ORDER — HYDROCODONE-ACETAMINOPHEN 5-325 MG PO TABS: 1.0000 | ORAL_TABLET | ORAL | 0 refills | Status: AC | PRN

## 2023-11-24 MED ORDER — PREDNISONE 10 MG PO TABS: 10.0000 mg | ORAL_TABLET | Freq: Every day | ORAL | 0 refills | Status: AC

## 2023-11-24 NOTE — ED Triage Notes (Signed)
 Pt comes with right elbow pain that radiates to her hand. Pt state this started two days ago and was worse last night.

## 2023-11-24 NOTE — ED Notes (Addendum)
 See triage note  Presents with arm pain    States pain is on the right  Started 2 days ago  Moving into hand  Denies any specific injury

## 2023-11-24 NOTE — ED Provider Notes (Signed)
   [facility] Provider Note    Event Date/Time   First MD Initiated Contact with Patient 11/24/23 0831     (approximate)  History   Chief Complaint: Arm Pain  HPI  KARLITA LICHTMAN is a 38 y.o. female with a past medical history of asthma who presents to the emergency department for right arm pain.  According to the patient for the last 2 to 3 days she has had progressively worsening pain in her right arm and wrist.  Patient denies any trauma.  Patient has slight swelling to this area.  No fever.  No history of gout.  Physical Exam   Triage Vital Signs: ED Triage Vitals  Encounter Vitals Group     BP 11/24/23 0821 133/87     Girls Systolic BP Percentile --      Girls Diastolic BP Percentile --      Boys Systolic BP Percentile --      Boys Diastolic BP Percentile --      Pulse Rate 11/24/23 0821 75     Resp 11/24/23 0821 19     Temp 11/24/23 0821 98.2 F (36.8 C)     Temp src --      SpO2 11/24/23 0821 100 %     Weight 11/24/23 0820 180 lb (81.6 kg)     Height 11/24/23 0820 5' 4 (1.626 m)     Head Circumference --      Peak Flow --      Pain Score 11/24/23 0820 6     Pain Loc --      Pain Education --      Exclude from Growth Chart --     Most recent vital signs: Vitals:   11/24/23 0821  BP: 133/87  Pulse: 75  Resp: 19  Temp: 98.2 F (36.8 C)  SpO2: 100%    General: Awake, no distress.  CV:  Good peripheral perfusion.   Resp:  Normal effort.   Abd:  No distention.  Other:  Mild tenderness to palpation of the right forearm.  Moderate tenderness of the right wrist but good range of motion although pain elicited.  Neurovascularly intact distally.  Good range of motion in shoulder and elbow with no pain elicited.   ED Results / Procedures / Treatments   MEDICATIONS ORDERED IN ED: Medications - No data to display   IMPRESSION / MDM / ASSESSMENT AND PLAN / ED COURSE  I reviewed the triage vital signs and the nursing  notes.  Patient's presentation is most consistent with acute illness / injury with system symptoms.  Patient presents emergency department for acute pain into the right wrist.  Does have tenderness to this area she states some subjective swelling although no objective swelling on my evaluation.  Neurovascularly intact.  Nontraumatic injury.  Do not believe x-rays would be of benefit.  No fever.  Suspect likely inflammatory reaction such as arthritic/arthritis flare or gout.  We will treat with a course of prednisone  and a short course of pain medication.  Will place in a removable Velcro wrist brace.  Discussed with the patient to wear this only for 7 to 10 days and then remove.  Will refer to orthopedics.  Patient agreeable to plan.  FINAL CLINICAL IMPRESSION(S) / ED DIAGNOSES   Right wrist pain    Note:  This document was prepared using Dragon voice recognition software and may include unintentional dictation errors.   Dorothyann Drivers, MD 11/24/23 [phone]

## 2024-01-30 ENCOUNTER — Emergency Department (HOSPITAL_COMMUNITY): Payer: MEDICAID

## 2024-01-30 ENCOUNTER — Emergency Department (HOSPITAL_COMMUNITY)
Admission: EM | Admit: 2024-01-30 | Discharge: 2024-01-30 | Disposition: A | Discharge status: Home / Self Care | Payer: MEDICAID | Attending: Emergency Medicine | Admitting: Emergency Medicine

## 2024-01-30 ENCOUNTER — Other Ambulatory Visit: Payer: Self-pay

## 2024-01-30 DIAGNOSIS — R10A2 Flank pain, left side: Secondary | ICD-10-CM | POA: Insufficient documentation

## 2024-01-30 DIAGNOSIS — N3000 Acute cystitis without hematuria: Secondary | ICD-10-CM

## 2024-01-30 LAB — BASIC METABOLIC PANEL WITH GFR
Anion gap: 9 (ref 5–15)
BUN: 16 mg/dL (ref 6–20)
CO2: 28 mmol/L (ref 22–32)
Calcium: 8.9 mg/dL (ref 8.9–10.3)
Chloride: 103 mmol/L (ref 98–111)
Creatinine, Ser: 0.81 mg/dL (ref 0.44–1.00)
GFR, Estimated: >60 mL/min (ref >60)
Glucose, Bld: 58 mg/dL — ABNORMAL LOW (ref 70–99)
Potassium: 3.4 mmol/L — ABNORMAL LOW (ref 3.5–5.1)
Sodium: 140 mmol/L (ref 135–145)

## 2024-01-30 LAB — URINALYSIS, ROUTINE W REFLEX MICROSCOPIC
Bilirubin Urine: NEGATIVE
Glucose, UA: NEGATIVE mg/dL
Ketones, ur: NEGATIVE mg/dL
Nitrite: NEGATIVE
Protein, ur: NEGATIVE mg/dL
Specific Gravity, Urine: 1.018 (ref 1.005–1.030)
pH: 5 (ref 5.0–8.0)

## 2024-01-30 LAB — CBC
HCT: 35.6 % — ABNORMAL LOW (ref 36.0–46.0)
Hemoglobin: 12.1 g/dL (ref 12.0–15.0)
MCH: 31.5 pg (ref 26.0–34.0)
MCHC: 34 g/dL (ref 30.0–36.0)
MCV: 92.7 fL (ref 80.0–100.0)
Platelets: 346 K/uL (ref 150–400)
RBC: 3.84 MIL/uL — ABNORMAL LOW (ref 3.87–5.11)
RDW: 12.7 % (ref 11.5–15.5)
WBC: 6.4 K/uL (ref 4.0–10.5)
nRBC: 0 % (ref 0.0–0.2)

## 2024-01-30 LAB — HCG, SERUM, QUALITATIVE: Preg, Serum: NEGATIVE

## 2024-01-30 MED ORDER — CEPHALEXIN 500 MG PO CAPS: 500.0000 mg | ORAL_CAPSULE | Freq: Three times a day (TID) | ORAL | 0 refills | Status: AC

## 2024-01-30 MED ORDER — CEPHALEXIN 500 MG PO CAPS
500.0000 mg | ORAL_CAPSULE | Freq: Once | ORAL | Status: AC
  Administered 2024-01-30: 500 mg via ORAL
  Filled 2024-01-30: qty 1

## 2024-01-30 MED ORDER — SODIUM CHLORIDE 0.9 % IV SOLN: 1.0000 g | Freq: Once | INTRAVENOUS | Status: DC

## 2024-01-30 MED ORDER — ONDANSETRON 4 MG PO TBDP: 4.0000 mg | ORAL_TABLET | Freq: Three times a day (TID) | ORAL | 0 refills | Status: AC | PRN

## 2024-01-30 NOTE — ED Notes (Signed)
 Pt has returned from CT.

## 2024-01-30 NOTE — ED Notes (Signed)
..  The patient is A&OX4, ambulatory at d/c with independent steady gait, NAD. Pt verbalized understanding of d/c instructions, prescriptions and follow up care.

## 2024-01-30 NOTE — ED Triage Notes (Signed)
 PT ambulatory to triage with complaints of LEFT sided flank and back pain that began approx 1 week ago. Pt also reports foul smelling urine. PT endorses a hx of UTI and kidney infections in the past.

## 2024-01-30 NOTE — ED Provider Notes (Signed)
 [place] EMERGENCY DEPARTMENT AT [facility] Provider Note   CSN: [id number] Arrival date & time: 01/30/24  1738     Patient presents with: Flank Pain   Jessica Wong is a 38 y.o. female.   PMH of prior UTIs, x 2 in the last 6 months presenting with dysuria and a sensation of incomplete emptying and left-sided flank pain x 1 week.  She reports that this is similar to prior UTIs she has had in the past.  Denies fevers chills blood in the urine, nausea, vomiting.  Flank pain is constant dull and achy, urinary pain is more pressure-like.  She denies ever having had a kidney stone in the past.   Flank Pain       Prior to Admission medications   Medication Sig Start Date End Date Taking? Authorizing Provider  acetaminophen  (TYLENOL ) 500 MG tablet Take 1,000 mg by mouth every 6 (six) hours as needed.    [provider]  albuterol  (PROVENTIL ) (2.5 MG/3ML) 0.083% nebulizer solution Take 3 mLs (2.5 mg total) by nebulization every 6 (six) hours as needed for wheezing or shortness of breath. 10/21/19   Alona Knee, PA-C  amoxicillin  (AMOXIL ) 500 MG capsule Take 1 capsule (500 mg total) by mouth 3 (three) times daily. 02/25/23   Sung, Jade J, MD  HYDROcodone -acetaminophen  (NORCO/VICODIN) 5-325 MG tablet Take 1 tablet by mouth every 4 (four) hours as needed. 11/24/23   Dorothyann Drivers, MD  magic mouthwash SOLN 12mL Anbesol 30mL Benadryl  30mL Mylanta  5mL swish, gargle & spit q8hr prn throat discomfort 02/25/23   Sung, Jade J, MD  meloxicam  (MOBIC ) 15 MG tablet Take 1 tablet (15 mg total) by mouth daily. 10/06/20   Cuthriell, Dorn JONETTA, PA-C  predniSONE  (DELTASONE ) 10 MG tablet Take 1 tablet (10 mg total) by mouth daily. Day 1-3: take 4 tablets PO daily Day 4-6: take 3 tablets PO daily Day 7-9: take 2 tablets PO daily Day 10-12: take 1 tablet PO daily 11/24/23   Paduchowski, Kevin, MD    Allergies: Metronidazole, Bactrim [sulfamethoxazole-trimethoprim], Tape, and  Tramadol     Review of Systems  Genitourinary:  Positive for flank pain.    Updated Vital Signs BP (!) 146/94 (BP Location: Left Arm)   Pulse 70   Temp 98.3 F (36.8 C) (Oral)   Resp 16   LMP 01/19/2024 (Exact Date)   SpO2 100%   Physical Exam Constitutional:      Appearance: Normal appearance.  HENT:     Nose: Nose normal.     Mouth/Throat:     Mouth: Mucous membranes are moist.     Pharynx: Oropharynx is clear.  Eyes:     Extraocular Movements: Extraocular movements intact.     Pupils: Pupils are equal, round, and reactive to light.  Cardiovascular:     Rate and Rhythm: Normal rate and regular rhythm.  Pulmonary:     Effort: Pulmonary effort is normal.     Breath sounds: Normal breath sounds.  Abdominal:     General: Abdomen is flat.     Palpations: Abdomen is soft.     Tenderness: There is abdominal tenderness. There is left CVA tenderness.  Musculoskeletal:        General: Normal range of motion.     Cervical back: Normal range of motion and neck supple.  Skin:    General: Skin is warm and dry.     Capillary Refill: Capillary refill takes less than 2 seconds.  Neurological:  Mental Status: She is alert.     (all labs ordered are listed, but only abnormal results are displayed) Labs Reviewed  URINALYSIS, ROUTINE W REFLEX MICROSCOPIC - Abnormal; Notable for the following components:      Result Value   APPearance HAZY (*)    Hgb urine dipstick SMALL (*)    Leukocytes,Ua LARGE (*)    Bacteria, UA RARE (*)    All other components within normal limits  BASIC METABOLIC PANEL WITH GFR - Abnormal; Notable for the following components:   Potassium 3.4 (*)    Glucose, Bld 58 (*)    All other components within normal limits  CBC - Abnormal; Notable for the following components:   RBC 3.84 (*)    HCT 35.6 (*)    All other components within normal limits  URINE CULTURE  HCG, SERUM, QUALITATIVE    EKG: None  Radiology: No results found.   Procedures    Medications Ordered in the ED - No data to display                                  Medical Decision Making This is a female patient with 2 reported UTIs in the past 6 months now presenting with presumably her third.  CBC and CMP collected in triage and unremarkable, UA shows rare bacteria and leukocytes.  Will send urine for culture and obtain CT scan to assess for upper urinary tract involvement.  Her symptoms could also be consistent with a stone and CT scan will hopefully show presence of stone if there.  Currently patient is comfortable as she took ibuprofen  prior to coming in, will reassess following further lab workup.  CT negative for hydronephrosis or kidney stone.  Prior culture reports showed pansensitive E. coli, will treat empirically with Keflex  for 7 days.  Will also send Zofran  in case patient experiences nausea/vomiting.  Amount and/or Complexity of Data Reviewed Labs: ordered. Radiology: ordered.  Risk Prescription drug management.   Final diagnoses:  None    ED Discharge Orders     None          Cleotilde Lukes, DO 01/30/24 2138    Doretha Folks, MD 01/30/24 2150

## 2024-02-02 LAB — URINE CULTURE: Culture: >=100000 — AB

## 2024-02-03 ENCOUNTER — Telehealth (HOSPITAL_BASED_OUTPATIENT_CLINIC_OR_DEPARTMENT_OTHER): Payer: Self-pay | Admitting: *Deleted

## 2024-02-03 NOTE — Telephone Encounter (Signed)
 Post ED Visit - Positive Culture Follow-up  Culture report reviewed by antimicrobial stewardship pharmacist: [facility] []  [name], Pharm.D., BCPS AQ-ID []  [name], Pharm.D., BCPS []  [name], [address].D., BCPS []  [name], [place].D., BCPS, AAHIVP []  [name], Pharm.D., BCPS, AAHIVP []  [name], PharmD, BCPS []  [name], PharmD, BCPS []  [name], PharmD, BCPS []  [name], PharmD []  [name], PharmD, BCPS []  [name], PharmD  [name] [x]  [name], PharmD []  [name], PharmD []  [name], PharmD []  [name], Rph []  [name], PharmD []  [name], PharmD []  [name], PharmD []  [name], PharmD []  [name], PharmD []  [name], PharmD []  [name], PharmD []  [name], PharmD []  [name], PharmD   Positive urine culture Treated with cephalexin , organism sensitive to the same and no further patient follow-up is required at this time.  Jessica Wong 02/03/2024, 1:39 PM
